# Patient Record
Sex: Female | Born: 1985 | Race: Black or African American | Hispanic: No | Marital: Single | Smoking: Former smoker
Health system: Southern US, Community
[De-identification: ages and names within clinical notes are randomized; demographics above are authoritative.]

## PROBLEM LIST (undated history)

## (undated) ENCOUNTER — Inpatient Hospital Stay (HOSPITAL_COMMUNITY): Payer: Self-pay

## (undated) DIAGNOSIS — B9689 Other specified bacterial agents as the cause of diseases classified elsewhere: Secondary | ICD-10-CM

## (undated) DIAGNOSIS — N760 Acute vaginitis: Secondary | ICD-10-CM

## (undated) DIAGNOSIS — Z8619 Personal history of other infectious and parasitic diseases: Secondary | ICD-10-CM

## (undated) DIAGNOSIS — IMO0002 Reserved for concepts with insufficient information to code with codable children: Secondary | ICD-10-CM

## (undated) DIAGNOSIS — R87619 Unspecified abnormal cytological findings in specimens from cervix uteri: Secondary | ICD-10-CM

## (undated) DIAGNOSIS — Z331 Pregnant state, incidental: Secondary | ICD-10-CM

## (undated) DIAGNOSIS — Z8742 Personal history of other diseases of the female genital tract: Secondary | ICD-10-CM

## (undated) DIAGNOSIS — F329 Major depressive disorder, single episode, unspecified: Secondary | ICD-10-CM

## (undated) DIAGNOSIS — R51 Headache: Secondary | ICD-10-CM

## (undated) DIAGNOSIS — F32A Depression, unspecified: Secondary | ICD-10-CM

## (undated) HISTORY — DX: Reserved for concepts with insufficient information to code with codable children: IMO0002

## (undated) HISTORY — DX: Major depressive disorder, single episode, unspecified: F32.9

## (undated) HISTORY — DX: Headache: R51

## (undated) HISTORY — DX: Personal history of other diseases of the female genital tract: Z87.42

## (undated) HISTORY — DX: Depression, unspecified: F32.A

## (undated) HISTORY — DX: Unspecified abnormal cytological findings in specimens from cervix uteri: R87.619

## (undated) HISTORY — DX: Personal history of other infectious and parasitic diseases: Z86.19

## (undated) HISTORY — DX: Pregnant state, incidental: Z33.1

---
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
## 2007-01-23 ENCOUNTER — Inpatient Hospital Stay (HOSPITAL_COMMUNITY): Admission: AD | Admit: 2007-01-23 | Discharge: 2007-01-23 | Payer: Self-pay | Admitting: Obstetrics & Gynecology

## 2007-07-01 ENCOUNTER — Inpatient Hospital Stay (HOSPITAL_COMMUNITY): Admission: AD | Admit: 2007-07-01 | Discharge: 2007-07-01 | Payer: Self-pay | Admitting: Obstetrics & Gynecology

## 2007-10-14 ENCOUNTER — Emergency Department (HOSPITAL_COMMUNITY): Admission: EM | Admit: 2007-10-14 | Discharge: 2007-10-14 | Payer: Self-pay | Admitting: Emergency Medicine

## 2008-02-18 ENCOUNTER — Inpatient Hospital Stay (HOSPITAL_COMMUNITY): Admission: AD | Admit: 2008-02-18 | Discharge: 2008-02-18 | Payer: Self-pay | Admitting: Obstetrics & Gynecology

## 2008-03-22 ENCOUNTER — Emergency Department (HOSPITAL_COMMUNITY): Admission: EM | Admit: 2008-03-22 | Discharge: 2008-03-22 | Payer: Self-pay | Admitting: Emergency Medicine

## 2008-04-07 ENCOUNTER — Emergency Department (HOSPITAL_COMMUNITY): Admission: EM | Admit: 2008-04-07 | Discharge: 2008-04-07 | Payer: Self-pay | Admitting: Emergency Medicine

## 2008-08-27 ENCOUNTER — Emergency Department (HOSPITAL_COMMUNITY): Admission: EM | Admit: 2008-08-27 | Discharge: 2008-08-27 | Payer: Self-pay | Admitting: Emergency Medicine

## 2009-04-18 ENCOUNTER — Other Ambulatory Visit: Payer: Self-pay | Admitting: Obstetrics & Gynecology

## 2009-04-18 ENCOUNTER — Inpatient Hospital Stay (HOSPITAL_COMMUNITY): Admission: AD | Admit: 2009-04-18 | Discharge: 2009-04-18 | Payer: Self-pay | Admitting: Obstetrics

## 2009-11-12 ENCOUNTER — Other Ambulatory Visit: Admission: RE | Admit: 2009-11-12 | Discharge: 2009-11-12 | Payer: Self-pay | Admitting: Gynecology

## 2009-11-12 ENCOUNTER — Ambulatory Visit: Payer: Self-pay | Admitting: Gynecology

## 2009-12-16 ENCOUNTER — Emergency Department (HOSPITAL_COMMUNITY): Admission: EM | Admit: 2009-12-16 | Discharge: 2009-12-16 | Payer: Self-pay | Admitting: Family Medicine

## 2010-01-04 ENCOUNTER — Emergency Department (HOSPITAL_COMMUNITY): Admission: EM | Admit: 2010-01-04 | Discharge: 2010-01-04 | Payer: Self-pay | Admitting: Emergency Medicine

## 2010-06-02 LAB — URINALYSIS, ROUTINE W REFLEX MICROSCOPIC
Glucose, UA: NEGATIVE mg/dL
Nitrite: NEGATIVE
Specific Gravity, Urine: 1.025 (ref 1.005–1.030)
pH: 6.5 (ref 5.0–8.0)

## 2010-06-02 LAB — WET PREP, GENITAL
Clue Cells Wet Prep HPF POC: NONE SEEN
Trich, Wet Prep: NONE SEEN

## 2010-06-02 LAB — GC/CHLAMYDIA PROBE AMP, GENITAL: Chlamydia, DNA Probe: NEGATIVE

## 2010-07-08 ENCOUNTER — Inpatient Hospital Stay (INDEPENDENT_AMBULATORY_CARE_PROVIDER_SITE_OTHER)
Admission: RE | Admit: 2010-07-08 | Discharge: 2010-07-08 | Disposition: A | Discharge status: Home / Self Care | Payer: Self-pay | Source: Ambulatory Visit | Attending: Family Medicine | Admitting: Family Medicine

## 2010-07-08 DIAGNOSIS — K047 Periapical abscess without sinus: Secondary | ICD-10-CM

## 2010-07-08 DIAGNOSIS — B373 Candidiasis of vulva and vagina: Secondary | ICD-10-CM

## 2010-07-08 LAB — POCT URINALYSIS DIP (DEVICE)
Bilirubin Urine: NEGATIVE
Glucose, UA: NEGATIVE mg/dL
Ketones, ur: NEGATIVE mg/dL
Nitrite: NEGATIVE
Specific Gravity, Urine: 1.02 (ref 1.005–1.030)
Urobilinogen, UA: 0.2 mg/dL (ref 0.0–1.0)

## 2010-11-28 ENCOUNTER — Emergency Department (HOSPITAL_COMMUNITY)
Admission: EM | Admit: 2010-11-28 | Discharge: 2010-11-28 | Disposition: A | Discharge status: Home / Self Care | Payer: Self-pay | Attending: Internal Medicine | Admitting: Internal Medicine

## 2010-11-28 DIAGNOSIS — N898 Other specified noninflammatory disorders of vagina: Secondary | ICD-10-CM | POA: Insufficient documentation

## 2010-11-28 DIAGNOSIS — K029 Dental caries, unspecified: Secondary | ICD-10-CM | POA: Insufficient documentation

## 2010-11-28 DIAGNOSIS — H5789 Other specified disorders of eye and adnexa: Secondary | ICD-10-CM | POA: Insufficient documentation

## 2010-11-28 LAB — URINALYSIS, ROUTINE W REFLEX MICROSCOPIC
Glucose, UA: NEGATIVE mg/dL
Leukocytes, UA: NEGATIVE
Protein, ur: NEGATIVE mg/dL
Urobilinogen, UA: 1 mg/dL (ref 0.0–1.0)

## 2010-11-28 LAB — WET PREP, GENITAL
Clue Cells Wet Prep HPF POC: NONE SEEN
Trich, Wet Prep: NONE SEEN
Yeast Wet Prep HPF POC: NONE SEEN

## 2010-11-28 LAB — URINE MICROSCOPIC-ADD ON

## 2010-11-29 LAB — GC/CHLAMYDIA PROBE AMP, GENITAL: GC Probe Amp, Genital: NEGATIVE

## 2010-12-07 LAB — URINALYSIS, ROUTINE W REFLEX MICROSCOPIC
Bilirubin Urine: NEGATIVE
Hgb urine dipstick: NEGATIVE
Nitrite: NEGATIVE
Specific Gravity, Urine: 1.02
Urobilinogen, UA: 1
pH: 6.5

## 2010-12-07 LAB — WET PREP, GENITAL: Clue Cells Wet Prep HPF POC: NONE SEEN

## 2010-12-07 LAB — URINE MICROSCOPIC-ADD ON

## 2010-12-07 LAB — GC/CHLAMYDIA PROBE AMP, GENITAL: Chlamydia, DNA Probe: NEGATIVE

## 2010-12-10 LAB — RAPID STREP SCREEN (MED CTR MEBANE ONLY): Streptococcus, Group A Screen (Direct): NEGATIVE

## 2010-12-17 LAB — POCT PREGNANCY, URINE: Preg Test, Ur: NEGATIVE

## 2010-12-17 LAB — WET PREP, GENITAL

## 2010-12-21 LAB — URINALYSIS, ROUTINE W REFLEX MICROSCOPIC
Glucose, UA: NEGATIVE
Nitrite: NEGATIVE
Specific Gravity, Urine: 1.025
pH: 6

## 2010-12-21 LAB — GC/CHLAMYDIA PROBE AMP, GENITAL: GC Probe Amp, Genital: NEGATIVE

## 2010-12-21 LAB — POCT PREGNANCY, URINE
Operator id: 26329
Preg Test, Ur: NEGATIVE

## 2010-12-21 LAB — WET PREP, GENITAL

## 2011-02-15 ENCOUNTER — Inpatient Hospital Stay (HOSPITAL_COMMUNITY)
Admission: AD | Admit: 2011-02-15 | Discharge: 2011-02-15 | Disposition: A | Discharge status: Home / Self Care | Payer: Self-pay | Source: Ambulatory Visit | Attending: Obstetrics & Gynecology | Admitting: Obstetrics & Gynecology

## 2011-02-15 ENCOUNTER — Encounter (HOSPITAL_COMMUNITY): Payer: Self-pay | Admitting: *Deleted

## 2011-02-15 DIAGNOSIS — IMO0002 Reserved for concepts with insufficient information to code with codable children: Secondary | ICD-10-CM | POA: Insufficient documentation

## 2011-02-15 DIAGNOSIS — Z3201 Encounter for pregnancy test, result positive: Secondary | ICD-10-CM | POA: Insufficient documentation

## 2011-02-15 DIAGNOSIS — W57XXXA Bitten or stung by nonvenomous insect and other nonvenomous arthropods, initial encounter: Secondary | ICD-10-CM

## 2011-02-15 LAB — URINALYSIS, ROUTINE W REFLEX MICROSCOPIC
Glucose, UA: NEGATIVE mg/dL
Hgb urine dipstick: NEGATIVE
Specific Gravity, Urine: 1.02 (ref 1.005–1.030)
Urobilinogen, UA: 0.2 mg/dL (ref 0.0–1.0)
pH: 6 (ref 5.0–8.0)

## 2011-02-15 LAB — POCT PREGNANCY, URINE: Preg Test, Ur: POSITIVE

## 2011-02-15 MED ORDER — CLINDAMYCIN HCL 300 MG PO CAPS: 300.0000 mg | ORAL_CAPSULE | Freq: Three times a day (TID) | ORAL | Status: AC

## 2011-02-15 MED ORDER — PROMETHAZINE HCL 25 MG PO TABS: 25.0000 mg | ORAL_TABLET | Freq: Four times a day (QID) | ORAL | Status: DC | PRN

## 2011-02-15 NOTE — ED Provider Notes (Signed)
History     Chief Complaint  Patient presents with  . Abdominal Pain   HPI 25 y.o. G1P0 here requesting pregnancy test. Denies abdominal pain or vaginal bleeding. + nausea, no vomiting, dry heaving only. Also c/o "bug bites" on her arms that are swollen and tender, states the same thing happened before and she was treated for cellulitis.    Past Medical History  Diagnosis Date  . No pertinent past medical history     Past Surgical History  Procedure Date  . No past surgeries     History reviewed. No pertinent family history.  History  Substance Use Topics  . Smoking status: Current Everyday Smoker -- 0.2 packs/day  . Smokeless tobacco: Not on file  . Alcohol Use: Yes    Allergies: No Known Allergies  Prescriptions prior to admission  Medication Sig Dispense Refill  . ibuprofen (ADVIL,MOTRIN) 200 MG tablet Take 400 mg by mouth every 6 (six) hours as needed. Pain          Review of Systems  Constitutional: Negative.   Respiratory: Negative.   Cardiovascular: Negative.   Gastrointestinal: Positive for nausea. Negative for vomiting, abdominal pain, diarrhea and constipation.  Genitourinary: Negative for dysuria, urgency, frequency, hematuria and flank pain.       Negative for vaginal bleeding, vaginal discharge, dyspareunia  Musculoskeletal: Negative.   Skin: Positive for rash.  Neurological: Negative.   Psychiatric/Behavioral: Negative.    Physical Exam   Blood pressure 128/73, pulse 88, temperature 99 F (37.2 C), temperature source Oral, resp. rate 20, height 5\' 4"  (1.626 m), weight 84.596 kg (186 lb 8 oz), last menstrual period 01/14/2011, SpO2 99.00%.  Physical Exam  Nursing note and vitals reviewed. Constitutional: She is oriented to person, place, and time. She appears well-developed and well-nourished. No distress.  Cardiovascular: Normal rate.   Respiratory: Effort normal.  GI: Soft. There is no tenderness.  Musculoskeletal: Normal range of motion.    Neurological: She is alert and oriented to person, place, and time.  Skin: Skin is warm and dry.          Left arm - erythematous, swollen, tender area with small central lesion that is c/w insect bite Right upper arm - smaller insect bite with no erythema or swelling     MAU Course  Procedures  Results for orders placed during the hospital encounter of 02/15/11 (from the past 24 hour(s))  URINALYSIS, ROUTINE W REFLEX MICROSCOPIC     Status: Normal   Collection Time   02/15/11  8:45 PM      Component Value Range   Color, Urine YELLOW  YELLOW    APPearance CLEAR  CLEAR    Specific Gravity, Urine 1.020  1.005 - 1.030    pH 6.0  5.0 - 8.0    Glucose, UA NEGATIVE  NEGATIVE (mg/dL)   Hgb urine dipstick NEGATIVE  NEGATIVE    Bilirubin Urine NEGATIVE  NEGATIVE    Ketones, ur NEGATIVE  NEGATIVE (mg/dL)   Protein, ur NEGATIVE  NEGATIVE (mg/dL)   Urobilinogen, UA 0.2  0.0 - 1.0 (mg/dL)   Nitrite NEGATIVE  NEGATIVE    Leukocytes, UA NEGATIVE  NEGATIVE   POCT PREGNANCY, URINE     Status: Normal   Collection Time   02/15/11  8:50 PM      Component Value Range   Preg Test, Ur POSITIVE       Assessment and Plan  25 y.o. G1P0 with positive UPT Pregnancy verification given - start  prenatal care ASAP, rev'd precautions Cellulitis left arm - rx clindamycin, f/u if no improvement  Angela Brock 02/15/2011, 9:11 PM

## 2011-02-15 NOTE — Progress Notes (Signed)
N. [name], CNM at bedside.  Assessment done and poc discussed with pt.  

## 2011-02-15 NOTE — Progress Notes (Signed)
Pt presents to [facility] for c/o stomach pain and being late on her period.  Not sure if she might be pregnant or not.  Also has some bites with redness on lower left arm and upper righ arm.

## 2011-02-17 NOTE — ED Provider Notes (Signed)
Attestation of Attending Supervision of Advanced Practitioner: Evaluation and management procedures were performed by the PA/NP/CNM/OB Fellow under my supervision/collaboration. Chart reviewed, and agree with management and plan.  Duyen Beckom, M.D. 02/17/2011 8:25 AM   

## 2011-02-19 ENCOUNTER — Inpatient Hospital Stay (HOSPITAL_COMMUNITY)
Admission: AD | Admit: 2011-02-19 | Discharge: 2011-02-19 | Disposition: A | Discharge status: Home / Self Care | Payer: Medicaid Other | Source: Ambulatory Visit | Attending: Obstetrics and Gynecology | Admitting: Obstetrics and Gynecology

## 2011-02-19 ENCOUNTER — Encounter (HOSPITAL_COMMUNITY): Payer: Self-pay | Admitting: *Deleted

## 2011-02-19 ENCOUNTER — Inpatient Hospital Stay (HOSPITAL_COMMUNITY): Payer: Medicaid Other

## 2011-02-19 DIAGNOSIS — R1032 Left lower quadrant pain: Secondary | ICD-10-CM | POA: Insufficient documentation

## 2011-02-19 DIAGNOSIS — O99891 Other specified diseases and conditions complicating pregnancy: Secondary | ICD-10-CM | POA: Insufficient documentation

## 2011-02-19 DIAGNOSIS — R109 Unspecified abdominal pain: Secondary | ICD-10-CM

## 2011-02-19 DIAGNOSIS — O26899 Other specified pregnancy related conditions, unspecified trimester: Secondary | ICD-10-CM

## 2011-02-19 LAB — WET PREP, GENITAL
Trich, Wet Prep: NONE SEEN
Yeast Wet Prep HPF POC: NONE SEEN

## 2011-02-19 LAB — CBC
HCT: 37.2 % (ref 36.0–46.0)
Hemoglobin: 12.6 g/dL (ref 12.0–15.0)
MCHC: 33.9 g/dL (ref 30.0–36.0)
MCV: 80.3 fL (ref 78.0–100.0)

## 2011-02-19 LAB — ABO/RH: ABO/RH(D): A POS

## 2011-02-19 NOTE — Progress Notes (Signed)
Pt reports having a sharp cramping  pain in her LLQ x 4 days.Having some vaginal bleeding an irritation.

## 2011-02-19 NOTE — ED Provider Notes (Signed)
History   Pt presents today c/o LLQ pain for the past 4 days and she also noticed some vag spotting. She denies fever, dysuria, or severe pain. She states she is just worried that she could be pregnant in her tubes.  Chief Complaint  Patient presents with  . Abdominal Pain   HPI  OB History    Grav Para Term Preterm Abortions TAB SAB Ect Mult Living   1               Past Medical History  Diagnosis Date  . No pertinent past medical history     Past Surgical History  Procedure Date  . No past surgeries     Family History  Problem Relation Age of Onset  . Hypertension Mother     History  Substance Use Topics  . Smoking status: Current Everyday Smoker -- 0.2 packs/day  . Smokeless tobacco: Not on file  . Alcohol Use: Yes    Allergies: No Known Allergies  Prescriptions prior to admission  Medication Sig Dispense Refill  . clindamycin (CLEOCIN) 300 MG capsule Take 1 capsule (300 mg total) by mouth 3 (three) times daily.  21 capsule  0  . promethazine (PHENERGAN) 25 MG tablet Take 1 tablet (25 mg total) by mouth every 6 (six) hours as needed for nausea.  60 tablet  0    Review of Systems  Constitutional: Negative for fever.  Eyes: Negative for blurred vision.  Gastrointestinal: Positive for abdominal pain. Negative for nausea, vomiting, diarrhea and constipation.  Genitourinary: Negative for dysuria, urgency, frequency and hematuria.  Neurological: Negative for dizziness and headaches.  Psychiatric/Behavioral: Negative for depression and suicidal ideas.   Physical Exam   Blood pressure 115/78, pulse 82, temperature 98.5 F (36.9 C), temperature source Oral, resp. rate 18, height 5\' 4"  (1.626 m), weight 181 lb 6.4 oz (82.283 kg), last menstrual period 01/14/2011.  Physical Exam  Nursing note and vitals reviewed. Constitutional: She is oriented to person, place, and time. She appears well-developed and well-nourished. No distress.  HENT:  Head: Normocephalic and  atraumatic.  Eyes: EOM are normal. Pupils are equal, round, and reactive to light.  GI: Soft. She exhibits no distension. There is no tenderness. There is no rebound and no guarding.  Genitourinary: No bleeding around the vagina. Vaginal discharge found.       Cervix Lg/closed. No adnexal masses. Pt nontender on exam. Uterus 6-8wks size.  Neurological: She is alert and oriented to person, place, and time.  Skin: Skin is warm and dry. She is not diaphoretic.  Psychiatric: She has a normal mood and affect. Her behavior is normal. Judgment and thought content normal.    MAU Course  Procedures  Wet prep and GC/Chlamydia cultures done.  Results for orders placed during the hospital encounter of 02/19/11 (from the past 24 hour(s))  ABO/RH     Status: Normal   Collection Time   02/19/11 12:10 PM      Component Value Range   ABO/RH(D) A POS    CBC     Status: Abnormal   Collection Time   02/19/11 12:10 PM      Component Value Range   WBC 10.7 (*) 4.0 - 10.5 (K/uL)   RBC 4.63  3.87 - 5.11 (MIL/uL)   Hemoglobin 12.6  12.0 - 15.0 (g/dL)   HCT 16.1  09.6 - 04.5 (%)   MCV 80.3  78.0 - 100.0 (fL)   MCH 27.2  26.0 - 34.0 (pg)  MCHC 33.9  30.0 - 36.0 (g/dL)   RDW 40.9  81.1 - 91.4 (%)   Platelets 371  150 - 400 (K/uL)  HCG, QUANTITATIVE, PREGNANCY     Status: Abnormal   Collection Time   02/19/11 12:10 PM      Component Value Range   hCG, Beta Chain, Quant, S 2683 (*) <5 (mIU/mL)  WET PREP, GENITAL     Status: Abnormal   Collection Time   02/19/11 12:25 PM      Component Value Range   Yeast, Wet Prep NONE SEEN  NONE SEEN    Trich, Wet Prep NONE SEEN  NONE SEEN    Clue Cells, Wet Prep FEW (*) NONE SEEN    WBC, Wet Prep HPF POC FEW (*) NONE SEEN    US shows intrauterine gestational sac. No yolk sac seen. Large subchorionic hemorrhage noted. Assessment and Plan  Pain in preg: discussed with pt at length. She will return in 2 days for repeat B-quant. Discussed diet, activity, risks, and  precautions.   Clinton Gallant. Saron Tweed III, DrHSc, MPAS, PA-C  02/19/2011, 12:30 PM   Henrietta Hoover, PA 02/19/11 [id number]

## 2011-02-20 NOTE — ED Provider Notes (Signed)
Agree with above note.  Angela Brock 02/20/2011 7:15 AM

## 2011-02-21 ENCOUNTER — Inpatient Hospital Stay (HOSPITAL_COMMUNITY)
Admission: AD | Admit: 2011-02-21 | Discharge: 2011-02-21 | Disposition: A | Discharge status: Home / Self Care | Payer: Self-pay | Source: Ambulatory Visit | Attending: Obstetrics & Gynecology | Admitting: Obstetrics & Gynecology

## 2011-02-21 DIAGNOSIS — O26859 Spotting complicating pregnancy, unspecified trimester: Secondary | ICD-10-CM

## 2011-02-21 LAB — GC/CHLAMYDIA PROBE AMP, GENITAL
Chlamydia, DNA Probe: NEGATIVE
GC Probe Amp, Genital: NEGATIVE

## 2011-02-21 NOTE — ED Provider Notes (Signed)
History     Chief Complaint  Patient presents with  . Follow-up   HPI 25 y.o. G1P0 at [redacted]w[redacted]d here for f/u HCG. Seen on 12/8 with bleeding, quant was 2683 and u/s showed 5 wk size IUGS with no yolk sac,no adnexal mass, large subchorionic hemorrhage. Today having only spotting.     Past Medical History  Diagnosis Date  . No pertinent past medical history     Past Surgical History  Procedure Date  . No past surgeries     Family History  Problem Relation Age of Onset  . Hypertension Mother     History  Substance Use Topics  . Smoking status: Current Everyday Smoker -- 0.2 packs/day  . Smokeless tobacco: Not on file  . Alcohol Use: Yes    Allergies: No Known Allergies  Prescriptions prior to admission  Medication Sig Dispense Refill  . acetaminophen (TYLENOL) 500 MG tablet Take 1,000 mg by mouth every 6 (six) hours as needed. Patient took this medication for pain.       . clindamycin (CLEOCIN) 300 MG capsule Take 1 capsule (300 mg total) by mouth 3 (three) times daily.  21 capsule  0  . promethazine (PHENERGAN) 25 MG tablet Take 1 tablet (25 mg total) by mouth every 6 (six) hours as needed for nausea.  60 tablet  0    Review of Systems  Constitutional: Negative.   Respiratory: Negative.   Cardiovascular: Negative.   Gastrointestinal: Negative for nausea, vomiting, abdominal pain, diarrhea and constipation.  Genitourinary: Negative for dysuria, urgency, frequency, hematuria and flank pain.       + spotting  Musculoskeletal: Negative.   Neurological: Negative.   Psychiatric/Behavioral: Negative.    Physical Exam   Blood pressure 132/70, pulse 91, temperature 98.5 F (36.9 C), temperature source Oral, resp. rate 20, last menstrual period 01/14/2011.  Physical Exam  Nursing note and vitals reviewed. Constitutional: She is oriented to person, place, and time. She appears well-developed and well-nourished. No distress.  Cardiovascular: Normal rate.   Respiratory:  Effort normal.  Musculoskeletal: Normal range of motion.  Neurological: She is alert and oriented to person, place, and time.  Skin: Skin is warm and dry.  Psychiatric: She has a normal mood and affect.    MAU Course  Procedures  Results for orders placed during the hospital encounter of 02/21/11 (from the past 24 hour(s))  HCG, QUANTITATIVE, PREGNANCY     Status: Abnormal   Collection Time   02/21/11  7:09 AM      Component Value Range   hCG, Beta Chain, Quant, S 5371 (*) <5 (mIU/mL)     Assessment and Plan  Appropriate rise in HCG Repeat u/s in 1 week Precautions rev'd  Constant Mandeville 02/21/2011, 8:06 AM

## 2011-02-21 NOTE — Progress Notes (Signed)
Patient to MAU for repeat BHCG. Patient states she is having a little left flank pain and spotting on an off.

## 2011-02-28 ENCOUNTER — Inpatient Hospital Stay (HOSPITAL_COMMUNITY)
Admission: AD | Admit: 2011-02-28 | Discharge: 2011-02-28 | Disposition: A | Discharge status: Home / Self Care | Payer: Self-pay | Source: Ambulatory Visit | Attending: Obstetrics & Gynecology | Admitting: Obstetrics & Gynecology

## 2011-02-28 ENCOUNTER — Encounter (HOSPITAL_COMMUNITY): Payer: Self-pay | Admitting: *Deleted

## 2011-02-28 ENCOUNTER — Inpatient Hospital Stay (HOSPITAL_COMMUNITY): Payer: Self-pay

## 2011-02-28 DIAGNOSIS — O99891 Other specified diseases and conditions complicating pregnancy: Secondary | ICD-10-CM | POA: Insufficient documentation

## 2011-02-28 DIAGNOSIS — Z3400 Encounter for supervision of normal first pregnancy, unspecified trimester: Secondary | ICD-10-CM

## 2011-02-28 MED ORDER — PROMETHAZINE HCL 25 MG PO TABS: 25.0000 mg | ORAL_TABLET | Freq: Four times a day (QID) | ORAL | Status: DC | PRN

## 2011-02-28 NOTE — Progress Notes (Signed)
Here for repeat/viability [place].  No further spotting.  Feeling nauseated frequently.

## 2011-02-28 NOTE — ED Provider Notes (Signed)
History   Pt presents today for viability [place]. She states she no longer has any bleeding. She does report nausea. She denies fever, vag dc, bleeding, pain, or any other sx at this time.  Chief Complaint  Patient presents with  . Follow-up   HPI  OB History    Grav Para Term Preterm Abortions TAB SAB Ect Mult Living   1               Past Medical History  Diagnosis Date  . No pertinent past medical history     Past Surgical History  Procedure Date  . No past surgeries     Family History  Problem Relation Age of Onset  . Hypertension Mother   . Anesthesia problems Neg Hx     History  Substance Use Topics  . Smoking status: Former Smoker -- 0.2 packs/day  . Smokeless tobacco: Never Used  . Alcohol Use: Yes     not currently    Allergies: No Known Allergies  Prescriptions prior to admission  Medication Sig Dispense Refill  . Prenatal Vit-Fe Fum-FA-Omega (PRENATAL MULTI +DHA PO) Take by mouth 1 day or 1 dose.        Marland Kitchen acetaminophen (TYLENOL) 500 MG tablet Take 1,000 mg by mouth every 6 (six) hours as needed. Patient took this medication for pain.         Review of Systems  Constitutional: Negative for fever.  Cardiovascular: Negative for chest pain.  Gastrointestinal: Positive for nausea and vomiting. Negative for abdominal pain, diarrhea and constipation.  Genitourinary: Negative for dysuria, urgency, frequency and hematuria.  Neurological: Negative for dizziness and headaches.  Psychiatric/Behavioral: Negative for depression and suicidal ideas.   Physical Exam   Blood pressure 112/68, pulse 84, temperature 98.9 F (37.2 C), temperature source Oral, resp. rate 18, height 5\' 3"  (1.6 m), weight 182 lb 6.4 oz (82.736 kg), last menstrual period 01/14/2011.  Physical Exam  Nursing note and vitals reviewed. Constitutional: She is oriented to person, place, and time. She appears well-developed and well-nourished. No distress.  HENT:  Head: Normocephalic and  atraumatic.  Eyes: EOM are normal. Pupils are equal, round, and reactive to light.  GI: Soft. She exhibits no distension. There is no tenderness. There is no rebound and no guarding.  Neurological: She is alert and oriented to person, place, and time.  Skin: Skin is warm and dry. She is not diaphoretic.  Psychiatric: She has a normal mood and affect. Her behavior is normal. Judgment and thought content normal.    MAU Course  Procedures  US shows single IUP with cardiac activity and an EGA of 6.1wks and an EDC of 10/23/11.  Assessment and Plan  IUP: discussed with pt at length. Will give Rx for phenergan. She will begin prenatal care. Discussed diet, activity, risks,and precautions.  Clinton Gallant. Sheyna Pettibone III, DrHSc, MPAS, PA-C  02/28/2011, 8:23 AM   Henrietta Hoover, PA 02/28/11 [phone]

## 2011-04-07 ENCOUNTER — Inpatient Hospital Stay (HOSPITAL_COMMUNITY)
Admission: AD | Admit: 2011-04-07 | Discharge: 2011-04-07 | Disposition: A | Discharge status: Home / Self Care | Payer: Medicaid Other | Source: Ambulatory Visit | Attending: Obstetrics & Gynecology | Admitting: Obstetrics & Gynecology

## 2011-04-07 ENCOUNTER — Encounter (HOSPITAL_COMMUNITY): Payer: Self-pay | Admitting: *Deleted

## 2011-04-07 DIAGNOSIS — B9689 Other specified bacterial agents as the cause of diseases classified elsewhere: Secondary | ICD-10-CM

## 2011-04-07 DIAGNOSIS — O2390 Unspecified genitourinary tract infection in pregnancy, unspecified trimester: Secondary | ICD-10-CM | POA: Insufficient documentation

## 2011-04-07 DIAGNOSIS — N760 Acute vaginitis: Secondary | ICD-10-CM

## 2011-04-07 DIAGNOSIS — A499 Bacterial infection, unspecified: Secondary | ICD-10-CM

## 2011-04-07 LAB — CBC
HCT: 30.9 % — ABNORMAL LOW (ref 36.0–46.0)
MCHC: 33.7 g/dL (ref 30.0–36.0)
MCV: 78.6 fL (ref 78.0–100.0)
RDW: 12.9 % (ref 11.5–15.5)

## 2011-04-07 LAB — URINALYSIS, ROUTINE W REFLEX MICROSCOPIC
Glucose, UA: NEGATIVE mg/dL
Hgb urine dipstick: NEGATIVE
Ketones, ur: NEGATIVE mg/dL
Protein, ur: NEGATIVE mg/dL

## 2011-04-07 LAB — WET PREP, GENITAL

## 2011-04-07 MED ORDER — ACETAMINOPHEN 325 MG PO TABS
650.0000 mg | ORAL_TABLET | ORAL | Status: AC
  Administered 2011-04-07: 650 mg via ORAL
  Filled 2011-04-07: qty 2

## 2011-04-07 MED ORDER — METRONIDAZOLE 500 MG PO TABS: 500.0000 mg | ORAL_TABLET | Freq: Two times a day (BID) | ORAL | Status: AC

## 2011-04-07 NOTE — Progress Notes (Signed)
Lots of discharge, has been dizzy, hasn't had baby checked.  First appt. 02/14.

## 2011-04-07 NOTE — ED Provider Notes (Signed)
History    G1 presents to MAU at 11w 6 d with vaginal discharge with itch and odor and feeling dizzy today and has a mild h/a.  She reports she has been busy today, has been on labor and delivery with her sister who had a baby.  No chief complaint on file.  HPI  OB History    Grav Para Term Preterm Abortions TAB SAB Ect Mult Living   1               Past Medical History  Diagnosis Date  . No pertinent past medical history     Past Surgical History  Procedure Date  . No past surgeries     Family History  Problem Relation Age of Onset  . Hypertension Mother   . Anesthesia problems Neg Hx     History  Substance Use Topics  . Smoking status: Former Smoker -- 0.2 packs/day  . Smokeless tobacco: Never Used  . Alcohol Use: Yes     not currently    Allergies: No Known Allergies  Prescriptions prior to admission  Medication Sig Dispense Refill  . acetaminophen (TYLENOL) 500 MG tablet Take 1,000 mg by mouth every 6 (six) hours as needed. Patient took this medication for pain.       . Prenatal Vit-Fe Fumarate-FA (PRENATAL MULTIVITAMIN) TABS Take 1 tablet by mouth daily.      Marland Kitchen DISCONTD: Prenatal Vit-Fe Fum-FA-Omega (PRENATAL MULTI +DHA PO) Take by mouth 1 day or 1 dose.          Review of Systems  Constitutional: Negative.   HENT: Negative.   Eyes: Negative.   Respiratory: Negative.   Cardiovascular: Negative.   Gastrointestinal: Negative.   Genitourinary: Negative.   Musculoskeletal: Negative.   Skin: Negative.   Neurological: Negative.   Endo/Heme/Allergies: Negative.   Psychiatric/Behavioral: Negative.    Physical Exam   Blood pressure 127/78, pulse 100, temperature 98.7 F (37.1 C), temperature source Oral, resp. rate 20, height 5\' 5"  (1.651 m), weight 83.462 kg (184 lb), last menstrual period 01/14/2011, SpO2 99.00%.  Physical Exam  Constitutional: She is oriented to person, place, and time. She appears well-developed and well-nourished.  Neck: Normal  range of motion.  Respiratory: Effort normal.  GI: Soft.  Genitourinary: Vagina normal. Uterus is enlarged. Cervix exhibits no motion tenderness and no friability.       Uterus ~11 cm Cervix pink, nonfriable with white thin discharge noted from cervical os and on vaginal walls  Musculoskeletal: Normal range of motion.  Neurological: She is alert and oriented to person, place, and time.  Skin: Skin is warm and dry.  Psychiatric: She has a normal mood and affect. Her behavior is normal. Judgment and thought content normal.   Results for orders placed during the hospital encounter of 04/07/11 (from the past 24 hour(s))  URINALYSIS, ROUTINE W REFLEX MICROSCOPIC     Status: Abnormal   Collection Time   04/07/11  5:30 PM      Component Value Range   Color, Urine YELLOW  YELLOW    APPearance HAZY (*) CLEAR    Specific Gravity, Urine 1.020  1.005 - 1.030    pH 6.5  5.0 - 8.0    Glucose, UA NEGATIVE  NEGATIVE (mg/dL)   Hgb urine dipstick NEGATIVE  NEGATIVE    Bilirubin Urine NEGATIVE  NEGATIVE    Ketones, ur NEGATIVE  NEGATIVE (mg/dL)   Protein, ur NEGATIVE  NEGATIVE (mg/dL)   Urobilinogen, UA 1.0  0.0 -  1.0 (mg/dL)   Nitrite NEGATIVE  NEGATIVE    Leukocytes, UA NEGATIVE  NEGATIVE   CBC     Status: Abnormal   Collection Time   04/07/11  8:15 PM      Component Value Range   WBC 10.5  4.0 - 10.5 (K/uL)   RBC 3.93  3.87 - 5.11 (MIL/uL)   Hemoglobin 10.4 (*) 12.0 - 15.0 (g/dL)   HCT 16.1 (*) 09.6 - 46.0 (%)   MCV 78.6  78.0 - 100.0 (fL)   MCH 26.5  26.0 - 34.0 (pg)   MCHC 33.7  30.0 - 36.0 (g/dL)   RDW 04.5  40.9 - 81.1 (%)   Platelets 309  150 - 400 (K/uL)  WET PREP, GENITAL     Status: Abnormal   Collection Time   04/07/11  8:20 PM      Component Value Range   Yeast, Wet Prep NONE SEEN  NONE SEEN    Trich, Wet Prep NONE SEEN  NONE SEEN    Clue Cells, Wet Prep MODERATE (*) NONE SEEN    WBC, Wet Prep HPF POC FEW (*) NONE SEEN    MAU Course  Procedures Pelvic exam with wet prep,  GC/Chlamydia Tylenol 650 mg PO for h/a  Assessment and Plan  A: Bacterial vaginosis  P: D/C home  Flagyl 500mg  PO BID for 7 days Encouraged PO fluids and rest tonight after being up on her feet all day today  LEFTW[place]-KIRBY, Lenin Kuhnle 04/07/2011, 8:42 PM

## 2011-05-14 ENCOUNTER — Inpatient Hospital Stay (HOSPITAL_COMMUNITY)
Admission: AD | Admit: 2011-05-14 | Discharge: 2011-05-14 | Disposition: A | Discharge status: Home / Self Care | Payer: Medicaid Other | Attending: Obstetrics and Gynecology | Admitting: Obstetrics and Gynecology

## 2011-05-14 ENCOUNTER — Encounter (HOSPITAL_COMMUNITY): Payer: Self-pay | Admitting: *Deleted

## 2011-05-14 ENCOUNTER — Inpatient Hospital Stay (HOSPITAL_COMMUNITY): Payer: Medicaid Other

## 2011-05-14 DIAGNOSIS — R1031 Right lower quadrant pain: Secondary | ICD-10-CM | POA: Insufficient documentation

## 2011-05-14 DIAGNOSIS — O99891 Other specified diseases and conditions complicating pregnancy: Secondary | ICD-10-CM | POA: Insufficient documentation

## 2011-05-14 DIAGNOSIS — R109 Unspecified abdominal pain: Secondary | ICD-10-CM

## 2011-05-14 DIAGNOSIS — W010XXA Fall on same level from slipping, tripping and stumbling without subsequent striking against object, initial encounter: Secondary | ICD-10-CM | POA: Insufficient documentation

## 2011-05-14 DIAGNOSIS — O26899 Other specified pregnancy related conditions, unspecified trimester: Secondary | ICD-10-CM

## 2011-05-14 LAB — URINALYSIS, ROUTINE W REFLEX MICROSCOPIC
Glucose, UA: NEGATIVE mg/dL
Ketones, ur: NEGATIVE mg/dL
Leukocytes, UA: NEGATIVE
Specific Gravity, Urine: 1.015 (ref 1.005–1.030)
pH: 7 (ref 5.0–8.0)

## 2011-05-14 MED ORDER — IBUPROFEN 600 MG PO TABS: 600.0000 mg | ORAL_TABLET | Freq: Four times a day (QID) | ORAL | Status: DC | PRN

## 2011-05-14 NOTE — ED Provider Notes (Signed)
History     Chief Complaint  Patient presents with  . Fall  . Abdominal Pain   HPI Pt presents at 18w 1d gestation with c/o s/p fall this evening when she struck the left side of her abdomen.  She states she tripped over her feet and tried to catch herself but struck the left side of her abdomen against the edge of a dresser.  She reports some lower right sided abd pain since that time.  She denies any vaginal bleeding or menstrual cramping.  She has not treated her pain.    OB History    Grav Para Term Preterm Abortions TAB SAB Ect Mult Living   1               Past Medical History  Diagnosis Date  . No pertinent past medical history     Past Surgical History  Procedure Date  . No past surgeries     Family History  Problem Relation Age of Onset  . Hypertension Mother   . Anesthesia problems Neg Hx   . Alcohol abuse Maternal Grandmother   . Alcohol abuse Maternal Grandfather   . Alcohol abuse Paternal Grandfather     History  Substance Use Topics  . Smoking status: Former Smoker -- 0.0 packs/day  . Smokeless tobacco: Never Used  . Alcohol Use: Yes     not currently    Allergies: No Known Allergies  Prescriptions prior to admission  Medication Sig Dispense Refill  . acetaminophen (TYLENOL) 500 MG tablet Take 1,000 mg by mouth every 6 (six) hours as needed. Patient took this medication for pain.       . ferrous sulfate 325 (65 FE) MG tablet Take 325 mg by mouth daily with breakfast.      . Prenatal Vit-Fe Fumarate-FA (PRENATAL MULTIVITAMIN) TABS Take 1 tablet by mouth daily.        Review of Systems  Constitutional: Negative.   HENT: Negative.   Eyes: Negative.   Respiratory: Negative.   Cardiovascular: Negative.   Gastrointestinal: Negative.   Genitourinary: Negative.   Musculoskeletal: Negative.   Skin: Negative.   Neurological: Negative.   Endo/Heme/Allergies: Negative.   Psychiatric/Behavioral: Negative.    Physical Exam   Blood pressure 117/69,  pulse 93, temperature 97 F (36.1 C), temperature source Oral, resp. rate 16, height 5\' 6"  (1.676 m), weight 88.361 kg (194 lb 12.8 oz), last menstrual period 01/14/2011.  Physical Exam  Constitutional: She is oriented to person, place, and time. She appears well-developed and well-nourished.  HENT:  Head: Normocephalic and atraumatic.  Right Ear: External ear normal.  Left Ear: External ear normal.  Nose: Nose normal.  Eyes: Conjunctivae are normal. Pupils are equal, round, and reactive to light.  Neck: Normal range of motion. Neck supple. No thyromegaly present.  Cardiovascular: Normal rate, regular rhythm and intact distal pulses.   Respiratory: Effort normal and breath sounds normal.  GI: Soft. Bowel sounds are normal.       No areas of discoloration or abrasions noted.  Genitourinary: Uterus normal.       Pelvic exam deferred.  Ut gravid, soft, non-tender and mobile to abd exam.  Musculoskeletal: Normal range of motion.  Neurological: She is alert and oriented to person, place, and time. She has normal reflexes.  Skin: Skin is warm and dry.  Psychiatric: She has a normal mood and affect. Her behavior is normal.  FHTs present with doppler.   MAU Course  Procedures Ultrasound:  Viable  IUP c/w dates.  Normal amniotic fluid volume.  Placenta without previa or evid of abruption noted.  Incidental finding of choroid plexus cyst noted.    Assessment and Plan  IUP at 18w 1d S/P fall Fetal choroid plexus cyst  C/W Dr. Stefano Gaul. CP cysts d/w pt and pt has appt with [facility] in the next week for anatomy ultrasound.  Has not had quad screen and will consider. F/U at [facility] as sched Discharge to home. D/W pt may use motrin OTC 600mg  every 6hrs as needed for discomfort. Revd warning signs/symptoms.   Elisheba Mcdonnell O. 05/14/2011, 6:11 PM

## 2011-05-14 NOTE — Discharge Instructions (Signed)
Abdominal Pain During Pregnancy Abdominal discomfort is common in pregnancy. Most of the time, it does not cause harm. There are many causes of abdominal pain. Some causes are more serious than others. Some of the causes of abdominal pain in pregnancy are easily diagnosed. Occasionally, the diagnosis takes time to understand. Other times, the cause is not determined. Abdominal pain can be a sign that something is very wrong with the pregnancy, or the pain may have nothing to do with the pregnancy at all. For this reason, always tell your caregiver if you have any abdominal discomfort. CAUSES Common and harmless causes of abdominal pain include:  Constipation.   Excess gas and bloating.   Round ligament pain. This is pain that is felt in the folds of the groin.   The position the baby or placenta is in.   Baby kicks.   Braxton-Hicks contractions. These are mild contractions that do not cause cervical dilation.  Serious causes of abdominal pain include:  Ectopic pregnancy. This happens when a fertilized egg implants outside of the uterus.   Miscarriage.   Preterm labor. This is when labor starts at less than 37 weeks of pregnancy.   Placental abruption. This is when the placenta partially or completely separates from the uterus.   Preeclampsia. This is often associated with high blood pressure and has been referred to as "toxemia in pregnancy."   Uterine or amniotic fluid infections.  Causes unrelated to pregnancy include:  Urinary tract infection.   Gallbladder stones or inflammation.   Hepatitis or other liver illness.   Intestinal problems, stomach flu, food poisoning, or ulcer.   Appendicitis.   Kidney (renal) stones.   Kidney infection (pylonephritis).  HOME CARE INSTRUCTIONS  For mild pain:  Do not have sexual intercourse or put anything in your vagina until your symptoms go away completely.   Get plenty of rest until your pain improves. If your pain does not  improve in 1 hour, call your caregiver.   Drink clear fluids if you feel nauseous. Avoid solid food as long as you are uncomfortable or nauseous.   Only take medicine as directed by your caregiver.   Keep all follow-up appointments with your caregiver.  SEEK IMMEDIATE MEDICAL CARE IF:  You are bleeding, leaking fluid, or passing tissue from the vagina.   You have increasing pain or cramping.   You have persistent vomiting.   You have painful or bloody urination.   You have a fever.   You notice a decrease in your baby's movements.   You have extreme weakness or feel faint.   You have shortness of breath, with or without abdominal pain.   You develop a severe headache with abdominal pain.   You have abnormal vaginal discharge with abdominal pain.   You have persistent diarrhea.   You have abdominal pain that continues even after rest, or gets worse.  MAKE SURE YOU:   Understand these instructions.   Will watch your condition.   Will get help right away if you are not doing well or get worse.  Document Released: 02/28/2005 Document Revised: 11/10/2010 Document Reviewed: 09/24/2010 ExitCare Patient Information 2012 [employer].Abdominal Pain During Pregnancy Abdominal discomfort is common in pregnancy. Most of the time, it does not cause harm. There are many causes of abdominal pain. Some causes are more serious than others. Some of the causes of abdominal pain in pregnancy are easily diagnosed. Occasionally, the diagnosis takes time to understand. Other times, the cause is not determined.   Abdominal pain can be a sign that something is very wrong with the pregnancy, or the pain may have nothing to do with the pregnancy at all. For this reason, always tell your caregiver if you have any abdominal discomfort. CAUSES Common and harmless causes of abdominal pain include:  Constipation.   Excess gas and bloating.   Round ligament pain. This is pain that is felt in the  folds of the groin.   The position the baby or placenta is in.   Baby kicks.   Braxton-Hicks contractions. These are mild contractions that do not cause cervical dilation.  Serious causes of abdominal pain include:  Ectopic pregnancy. This happens when a fertilized egg implants outside of the uterus.   Miscarriage.   Preterm labor. This is when labor starts at less than 37 weeks of pregnancy.   Placental abruption. This is when the placenta partially or completely separates from the uterus.   Preeclampsia. This is often associated with high blood pressure and has been referred to as "toxemia in pregnancy."   Uterine or amniotic fluid infections.  Causes unrelated to pregnancy include:  Urinary tract infection.   Gallbladder stones or inflammation.   Hepatitis or other liver illness.   Intestinal problems, stomach flu, food poisoning, or ulcer.   Appendicitis.   Kidney (renal) stones.   Kidney infection (pylonephritis).  HOME CARE INSTRUCTIONS  For mild pain:  Do not have sexual intercourse or put anything in your vagina until your symptoms go away completely.   Get plenty of rest until your pain improves. If your pain does not improve in 1 hour, call your caregiver.   Drink clear fluids if you feel nauseous. Avoid solid food as long as you are uncomfortable or nauseous.   Only take medicine as directed by your caregiver.   Keep all follow-up appointments with your caregiver.  SEEK IMMEDIATE MEDICAL CARE IF:  You are bleeding, leaking fluid, or passing tissue from the vagina.   You have increasing pain or cramping.   You have persistent vomiting.   You have painful or bloody urination.   You have a fever.   You notice a decrease in your baby's movements.   You have extreme weakness or feel faint.   You have shortness of breath, with or without abdominal pain.   You develop a severe headache with abdominal pain.   You have abnormal vaginal discharge  with abdominal pain.   You have persistent diarrhea.   You have abdominal pain that continues even after rest, or gets worse.  MAKE SURE YOU:   Understand these instructions.   Will watch your condition.   Will get help right away if you are not doing well or get worse.  Document Released: 02/28/2005 Document Revised: 11/10/2010 Document Reviewed: 09/24/2010 ExitCare Patient Information 2012 [employer]. 

## 2011-05-14 NOTE — ED Notes (Signed)
Pt reports falling on tiles floor last night at 10:00 pm striking left side of abdomen. She states she has has pain in left mid and low abdomen since fall and had difficulty sleeping due to pain.

## 2011-05-14 NOTE — Progress Notes (Signed)
Fall last night tripped and fell against lower abdominal pain, no vaginal bleeding.

## 2011-05-18 ENCOUNTER — Encounter (INDEPENDENT_AMBULATORY_CARE_PROVIDER_SITE_OTHER): Payer: Medicaid Other

## 2011-05-18 DIAGNOSIS — Z331 Pregnant state, incidental: Secondary | ICD-10-CM

## 2011-06-16 ENCOUNTER — Encounter (INDEPENDENT_AMBULATORY_CARE_PROVIDER_SITE_OTHER): Payer: Medicaid Other | Admitting: Obstetrics and Gynecology

## 2011-06-16 ENCOUNTER — Encounter (INDEPENDENT_AMBULATORY_CARE_PROVIDER_SITE_OTHER): Payer: Medicaid Other

## 2011-06-16 DIAGNOSIS — Z1389 Encounter for screening for other disorder: Secondary | ICD-10-CM

## 2011-06-16 DIAGNOSIS — F1920 Other psychoactive substance dependence, uncomplicated: Secondary | ICD-10-CM

## 2011-06-16 DIAGNOSIS — Z331 Pregnant state, incidental: Secondary | ICD-10-CM

## 2011-06-16 DIAGNOSIS — O99330 Smoking (tobacco) complicating pregnancy, unspecified trimester: Secondary | ICD-10-CM

## 2011-07-14 ENCOUNTER — Encounter: Payer: Medicaid Other | Admitting: Obstetrics and Gynecology

## 2011-07-14 ENCOUNTER — Other Ambulatory Visit: Payer: Medicaid Other

## 2011-07-18 ENCOUNTER — Encounter: Payer: Self-pay | Admitting: Obstetrics and Gynecology

## 2011-07-18 ENCOUNTER — Ambulatory Visit (INDEPENDENT_AMBULATORY_CARE_PROVIDER_SITE_OTHER): Payer: Medicaid Other | Admitting: Obstetrics and Gynecology

## 2011-07-18 ENCOUNTER — Other Ambulatory Visit: Payer: Medicaid Other

## 2011-07-18 ENCOUNTER — Telehealth: Payer: Self-pay

## 2011-07-18 VITALS — BP 100/68 | Wt 215.0 lb

## 2011-07-18 DIAGNOSIS — R12 Heartburn: Secondary | ICD-10-CM

## 2011-07-18 DIAGNOSIS — Z331 Pregnant state, incidental: Secondary | ICD-10-CM

## 2011-07-18 HISTORY — DX: Pregnant state, incidental: Z33.1

## 2011-07-18 LAB — HEMOGLOBIN: Hemoglobin: 10 g/dL — ABNORMAL LOW (ref 12.0–15.0)

## 2011-07-18 LAB — RPR

## 2011-07-18 LAB — GLUCOSE TOLERANCE, 1 HOUR: Glucose, 1 Hour GTT: 134 mg/dL (ref 70–140)

## 2011-07-18 MED ORDER — FAMOTIDINE 40 MG PO TABS: 40.0000 mg | ORAL_TABLET | Freq: Every day | ORAL | Status: DC

## 2011-07-18 NOTE — Telephone Encounter (Deleted)
A user error has taken place: encounter opened in error, closed for administrative reasons.

## 2011-07-18 NOTE — Progress Notes (Signed)
C/o heartburn prevents sleeping reqs rx, c/o carpule tunnel both hands, & anal pressure 1 gtt given today without difficultly

## 2011-07-18 NOTE — Progress Notes (Signed)
Patient ID: NECHAMA ESCUTIA, female   DOB: 1985-11-26, 26 y.o.   MRN: [id number] Requests 3 D [place] discussed no indication for additional [place] and would have to pay for it. C/O heartburn no relief with zantac or diet or positioning, c/o of pressure on butt O EGBUS perineum WNL     retcum apprears WNL no hemorrhoids A 26 week IUP P Revie, reviewed diet excessive wt gain, nutrition, exercise, reviewed s/s preterm labor, srom, vag bleeding, kick counts to report, enc 8 water daily and frequent voids 1gtt, RPR, HGB today Lavera Guise, CNM

## 2011-07-18 NOTE — Telephone Encounter (Signed)
A user error has taken place: encounter opened in error, closed for administrative reasons.

## 2011-07-18 NOTE — Progress Notes (Signed)
Addended by: Janeece Agee on: 07/18/2011 09:50 AM   Modules accepted: Orders

## 2011-08-01 ENCOUNTER — Encounter: Payer: Self-pay | Admitting: Obstetrics and Gynecology

## 2011-08-01 ENCOUNTER — Ambulatory Visit (INDEPENDENT_AMBULATORY_CARE_PROVIDER_SITE_OTHER): Payer: Medicaid Other | Admitting: Obstetrics and Gynecology

## 2011-08-01 VITALS — BP 112/60 | Temp 98.2°F | Wt 216.0 lb

## 2011-08-01 DIAGNOSIS — Z331 Pregnant state, incidental: Secondary | ICD-10-CM

## 2011-08-01 NOTE — Progress Notes (Signed)
Pt c/o of ear pain Temp 98.8 Physical Examination: General appearance - alert, well appearing, and in no distress Eyes - pupils equal and reactive, extraocular eye movements intact Ears - bilateral TM's and external ear canals normal, left ear normal, ceruminosis noted bilaterally.  Left ear draining with large amount of wax build up Try benadryl for three days and drain ear in the shower if no relief refer to ENT [facility] Pt desires to work only three days a weeks secondary to it feels to hard to work the entire week. Note given Nose - normal and patent, no erythema, discharge or polyps Mouth - mucous membranes moist, pharynx normal without lesions Neck - supple, no significant adenopathy

## 2011-08-12 ENCOUNTER — Encounter: Payer: Self-pay | Admitting: Obstetrics and Gynecology

## 2011-08-15 ENCOUNTER — Encounter: Payer: Medicaid Other | Admitting: Obstetrics and Gynecology

## 2011-08-16 ENCOUNTER — Ambulatory Visit (INDEPENDENT_AMBULATORY_CARE_PROVIDER_SITE_OTHER): Payer: Medicaid Other | Admitting: Obstetrics and Gynecology

## 2011-08-16 ENCOUNTER — Encounter: Payer: Self-pay | Admitting: Obstetrics and Gynecology

## 2011-08-16 VITALS — BP 124/56 | Wt 221.0 lb

## 2011-08-16 DIAGNOSIS — R12 Heartburn: Secondary | ICD-10-CM

## 2011-08-16 DIAGNOSIS — Z331 Pregnant state, incidental: Secondary | ICD-10-CM

## 2011-08-16 MED ORDER — FAMOTIDINE 40 MG PO TABS: 40.0000 mg | ORAL_TABLET | Freq: Every day | ORAL | Status: DC

## 2011-08-16 NOTE — Progress Notes (Signed)
No concerns today 

## 2011-08-16 NOTE — Progress Notes (Signed)
[redacted]w[redacted]d Doing well, wants to start maternity leave at 33wks, works at [employer], plans epidural Will get [place] for S>D rv'd [facility] and PTL sx's

## 2011-08-16 NOTE — Patient Instructions (Signed)
Preventing Preterm Labor Preterm labor is when a pregnant woman has contractions that cause the cervix to open, shorten, and thin before 37 weeks of pregnancy. You will have regular contractions (tightening) 2 to 3 minutes apart. This usually causes discomfort or pain. HOME CARE  Eat a healthy diet.   Take your vitamins as told by your doctor.   Drink enough fluids to keep your pee (urine) clear or pale yellow every day.   Get rest and sleep.   Do not have sex if you are at high risk for preterm labor.   Follow your doctor's advice about activity, medicines, and tests.   Avoid stress.   Avoid hard labor or exercise that lasts for a long time.   Do not smoke.  GET HELP RIGHT AWAY IF:   You are having contractions.   You have belly (abdominal) pain.   You have bleeding from your vagina.   You have pain when you pee (urinate).   You have abnormal discharge from your vagina.   You have a temperature by mouth above 102 F (38.9 C).  MAKE SURE YOU:  Understand these instructions.   Will watch your condition.   Will get help if you are not doing well or get worse.  Document Released: 05/27/2008 Document Revised: 02/17/2011 Document Reviewed: 05/27/2008 ExitCare Patient Information 2012 [employer].Fetal Movement Counts Patient Name: __________________________________________________ Patient Due Date: ____________________ Kick counts is highly recommended in high risk pregnancies, but it is a good idea for every pregnant woman to do. Start counting fetal movements at 28 weeks of the pregnancy. Fetal movements increase after eating a full meal or eating or drinking something sweet (the blood sugar is higher). It is also important to drink plenty of fluids (well hydrated) before doing the count. Lie on your left side because it helps with the circulation or you can sit in a comfortable chair with your arms over your belly (abdomen) with no distractions around you. DOING THE  COUNT  Try to do the count the same time of day each time you do it.   Mark the day and time, then see how long it takes for you to feel 10 movements (kicks, flutters, swishes, rolls). You should have at least 10 movements within 2 hours. You will most likely feel 10 movements in much less than 2 hours. If you do not, wait an hour and count again. After a couple of days you will see a pattern.   What you are looking for is a change in the pattern or not enough counts in 2 hours. Is it taking longer in time to reach 10 movements?  SEEK MEDICAL CARE IF:  You feel less than 10 counts in 2 hours. Tried twice.   No movement in one hour.   The pattern is changing or taking longer each day to reach 10 counts in 2 hours.   You feel the baby is not moving as it usually does.  Date: ____________ Movements: ____________ Start time: ____________ Finish time: ____________  Date: ____________ Movements: ____________ Start time: ____________ Finish time: ____________ Date: ____________ Movements: ____________ Start time: ____________ Finish time: ____________ Date: ____________ Movements: ____________ Start time: ____________ Finish time: ____________ Date: ____________ Movements: ____________ Start time: ____________ Finish time: ____________ Date: ____________ Movements: ____________ Start time: ____________ Finish time: ____________ Date: ____________ Movements: ____________ Start time: ____________ Finish time: ____________ Date: ____________ Movements: ____________ Start time: ____________ Finish time: ____________  Date: ____________ Movements: ____________ Start time: ____________ Finish time:   ____________ Date: ____________ Movements: ____________ Start time: ____________ Finish time: ____________ Date: ____________ Movements: ____________ Start time: ____________ Finish time: ____________ Date: ____________ Movements: ____________ Start time: ____________ Finish time: ____________ Date:  ____________ Movements: ____________ Start time: ____________ Finish time: ____________ Date: ____________ Movements: ____________ Start time: ____________ Finish time: ____________ Date: ____________ Movements: ____________ Start time: ____________ Finish time: ____________  Date: ____________ Movements: ____________ Start time: ____________ Finish time: ____________ Date: ____________ Movements: ____________ Start time: ____________ Finish time: ____________ Date: ____________ Movements: ____________ Start time: ____________ Finish time: ____________ Date: ____________ Movements: ____________ Start time: ____________ Finish time: ____________ Date: ____________ Movements: ____________ Start time: ____________ Finish time: ____________ Date: ____________ Movements: ____________ Start time: ____________ Finish time: ____________ Date: ____________ Movements: ____________ Start time: ____________ Finish time: ____________  Date: ____________ Movements: ____________ Start time: ____________ Finish time: ____________ Date: ____________ Movements: ____________ Start time: ____________ Finish time: ____________ Date: ____________ Movements: ____________ Start time: ____________ Finish time: ____________ Date: ____________ Movements: ____________ Start time: ____________ Finish time: ____________ Date: ____________ Movements: ____________ Start time: ____________ Finish time: ____________ Date: ____________ Movements: ____________ Start time: ____________ Finish time: ____________ Date: ____________ Movements: ____________ Start time: ____________ Finish time: ____________  Date: ____________ Movements: ____________ Start time: ____________ Finish time: ____________ Date: ____________ Movements: ____________ Start time: ____________ Finish time: ____________ Date: ____________ Movements: ____________ Start time: ____________ Finish time: ____________ Date: ____________ Movements: ____________ Start  time: ____________ Finish time: ____________ Date: ____________ Movements: ____________ Start time: ____________ Finish time: ____________ Date: ____________ Movements: ____________ Start time: ____________ Finish time: ____________ Date: ____________ Movements: ____________ Start time: ____________ Finish time: ____________  Date: ____________ Movements: ____________ Start time: ____________ Finish time: ____________ Date: ____________ Movements: ____________ Start time: ____________ Finish time: ____________ Date: ____________ Movements: ____________ Start time: ____________ Finish time: ____________ Date: ____________ Movements: ____________ Start time: ____________ Finish time: ____________ Date: ____________ Movements: ____________ Start time: ____________ Finish time: ____________ Date: ____________ Movements: ____________ Start time: ____________ Finish time: ____________ Date: ____________ Movements: ____________ Start time: ____________ Finish time: ____________  Date: ____________ Movements: ____________ Start time: ____________ Finish time: ____________ Date: ____________ Movements: ____________ Start time: ____________ Finish time: ____________ Date: ____________ Movements: ____________ Start time: ____________ Finish time: ____________ Date: ____________ Movements: ____________ Start time: ____________ Finish time: ____________ Date: ____________ Movements: ____________ Start time: ____________ Finish time: ____________ Date: ____________ Movements: ____________ Start time: ____________ Finish time: ____________ Date: ____________ Movements: ____________ Start time: ____________ Finish time: ____________  Date: ____________ Movements: ____________ Start time: ____________ Finish time: ____________ Date: ____________ Movements: ____________ Start time: ____________ Finish time: ____________ Date: ____________ Movements: ____________ Start time: ____________ Finish time:  ____________ Date: ____________ Movements: ____________ Start time: ____________ Finish time: ____________ Date: ____________ Movements: ____________ Start time: ____________ Finish time: ____________ Date: ____________ Movements: ____________ Start time: ____________ Finish time: ____________ Document Released: 03/30/2006 Document Revised: 02/17/2011 Document Reviewed: 09/30/2008 ExitCare Patient Information 2012 [employer]. 

## 2011-08-30 ENCOUNTER — Encounter: Payer: Self-pay | Admitting: Obstetrics and Gynecology

## 2011-08-30 ENCOUNTER — Ambulatory Visit (INDEPENDENT_AMBULATORY_CARE_PROVIDER_SITE_OTHER): Payer: Medicaid Other | Admitting: Obstetrics and Gynecology

## 2011-08-30 ENCOUNTER — Ambulatory Visit (INDEPENDENT_AMBULATORY_CARE_PROVIDER_SITE_OTHER): Payer: Medicaid Other

## 2011-08-30 VITALS — BP 108/58 | Wt 228.0 lb

## 2011-08-30 DIAGNOSIS — L293 Anogenital pruritus, unspecified: Secondary | ICD-10-CM

## 2011-08-30 DIAGNOSIS — Z331 Pregnant state, incidental: Secondary | ICD-10-CM

## 2011-08-30 DIAGNOSIS — Z3689 Encounter for other specified antenatal screening: Secondary | ICD-10-CM

## 2011-08-30 DIAGNOSIS — L292 Pruritus vulvae: Secondary | ICD-10-CM

## 2011-08-30 LAB — POCT WET PREP (WET MOUNT): Clue Cells Wet Prep Whiff POC: NEGATIVE

## 2011-08-30 MED ORDER — NYSTATIN-TRIAMCINOLONE 100000-0.1 UNIT/GM-% EX OINT: TOPICAL_OINTMENT | Freq: Two times a day (BID) | CUTANEOUS | Status: DC

## 2011-08-30 NOTE — Progress Notes (Signed)
Pt without complaints today

## 2011-08-30 NOTE — Progress Notes (Signed)
Ultrasound: Single gestation, vertex, normal fluid, normal placenta, 32 weeks and 6 days (66 percentile), cervix 3.49 cm. Patient complains of vulvar itching.  Wet prep: Negative for yeast. We'll try Mycolog ointment for 7 nights. Return office in 2 weeks. Dr. Stefano Gaul

## 2011-08-31 LAB — US OB COMP + 14 WK

## 2011-09-09 ENCOUNTER — Encounter: Payer: Self-pay | Admitting: Obstetrics and Gynecology

## 2011-09-09 DIAGNOSIS — B9689 Other specified bacterial agents as the cause of diseases classified elsewhere: Secondary | ICD-10-CM | POA: Insufficient documentation

## 2011-09-09 DIAGNOSIS — B379 Candidiasis, unspecified: Secondary | ICD-10-CM | POA: Insufficient documentation

## 2011-09-09 DIAGNOSIS — Z8619 Personal history of other infectious and parasitic diseases: Secondary | ICD-10-CM | POA: Insufficient documentation

## 2011-09-09 DIAGNOSIS — Z8742 Personal history of other diseases of the female genital tract: Secondary | ICD-10-CM | POA: Insufficient documentation

## 2011-09-12 ENCOUNTER — Ambulatory Visit (INDEPENDENT_AMBULATORY_CARE_PROVIDER_SITE_OTHER): Payer: Medicaid Other | Admitting: Obstetrics and Gynecology

## 2011-09-12 VITALS — BP 110/64 | Wt 229.0 lb

## 2011-09-12 DIAGNOSIS — Z331 Pregnant state, incidental: Secondary | ICD-10-CM

## 2011-09-12 NOTE — Progress Notes (Signed)
Doing well. Possible viral illness.  Drink plenty of fluids and get rest. Return office in 2 weeks. Dr. Stefano Gaul

## 2011-09-12 NOTE — Progress Notes (Signed)
C/o upset stomach

## 2011-09-26 ENCOUNTER — Ambulatory Visit (INDEPENDENT_AMBULATORY_CARE_PROVIDER_SITE_OTHER): Payer: Medicaid Other | Admitting: Obstetrics and Gynecology

## 2011-09-26 ENCOUNTER — Encounter: Payer: Self-pay | Admitting: Obstetrics and Gynecology

## 2011-09-26 VITALS — BP 124/62 | Wt 229.0 lb

## 2011-09-26 DIAGNOSIS — Z331 Pregnant state, incidental: Secondary | ICD-10-CM

## 2011-09-26 NOTE — Addendum Note (Signed)
Addended by: Tim Lair on: 09/26/2011 04:26 PM   Modules accepted: Orders

## 2011-09-26 NOTE — Progress Notes (Signed)
Beta strep, GC, Chlamydia today. Return to office in 1 week. Dr. Stefano Gaul

## 2011-09-26 NOTE — Progress Notes (Signed)
Pt states she has no concerns today. GBS today. Pt desires cervix check.

## 2011-09-27 LAB — GC/CHLAMYDIA PROBE AMP, GENITAL
Chlamydia, DNA Probe: NEGATIVE
GC Probe Amp, Genital: NEGATIVE

## 2011-10-05 ENCOUNTER — Encounter: Payer: Self-pay | Admitting: Obstetrics and Gynecology

## 2011-10-05 ENCOUNTER — Ambulatory Visit (INDEPENDENT_AMBULATORY_CARE_PROVIDER_SITE_OTHER): Payer: Medicaid Other | Admitting: Obstetrics and Gynecology

## 2011-10-05 VITALS — BP 122/64 | Wt 238.0 lb

## 2011-10-05 DIAGNOSIS — Z331 Pregnant state, incidental: Secondary | ICD-10-CM

## 2011-10-05 NOTE — Progress Notes (Signed)
Pt c/o heartburn also c/o SOB request cervix check

## 2011-10-05 NOTE — Progress Notes (Signed)
Pepcid 40mg  called into pt pharm at 5:46pm Per [place] Clock.

## 2011-10-05 NOTE — Progress Notes (Signed)
GC negative, Chlamydia negative, beta strep negative, Return to office in 1 week Dr. Stefano Gaul

## 2011-10-13 ENCOUNTER — Ambulatory Visit (INDEPENDENT_AMBULATORY_CARE_PROVIDER_SITE_OTHER): Payer: Medicaid Other | Admitting: Obstetrics and Gynecology

## 2011-10-13 ENCOUNTER — Encounter: Payer: Self-pay | Admitting: Obstetrics and Gynecology

## 2011-10-13 ENCOUNTER — Ambulatory Visit: Payer: Medicaid Other | Admitting: Obstetrics and Gynecology

## 2011-10-13 VITALS — BP 110/62 | Wt 236.0 lb

## 2011-10-13 DIAGNOSIS — Z331 Pregnant state, incidental: Secondary | ICD-10-CM

## 2011-10-13 NOTE — Progress Notes (Signed)
Doing well. Return office in 1 week. Dr. Shallen Luedke 

## 2011-10-13 NOTE — Progress Notes (Signed)
C/o SOB. Request cervix check.

## 2011-10-21 ENCOUNTER — Telehealth: Payer: Self-pay | Admitting: Obstetrics and Gynecology

## 2011-10-21 ENCOUNTER — Encounter: Payer: Medicaid Other | Admitting: Obstetrics and Gynecology

## 2011-10-21 NOTE — Telephone Encounter (Signed)
TC from pt. States is having painless contractions q 6 min x 45 min. +FM. Mucousy vag D/C.  Rescheduled appt she had today until 10/24/11.  Is currently in [place], [place]. Has only had one soda to drink today.  No water.  Advised to drink 32 oz water now. To call if contractions comtinue and are q 5 min or less x 1 hr, with watery vaginal D/C, bleeding or decreased FM.  REviewed instructions several times. Pt states is returnign to [place] soon.  Pt verbalizes comprehension of instructions.

## 2011-10-24 ENCOUNTER — Encounter: Payer: Self-pay | Admitting: Obstetrics and Gynecology

## 2011-10-24 ENCOUNTER — Ambulatory Visit (INDEPENDENT_AMBULATORY_CARE_PROVIDER_SITE_OTHER): Payer: Medicaid Other | Admitting: Obstetrics and Gynecology

## 2011-10-24 VITALS — BP 116/58 | Wt 238.0 lb

## 2011-10-24 DIAGNOSIS — Z3A41 41 weeks gestation of pregnancy: Secondary | ICD-10-CM

## 2011-10-24 DIAGNOSIS — O480 Post-term pregnancy: Secondary | ICD-10-CM

## 2011-10-24 DIAGNOSIS — Z331 Pregnant state, incidental: Secondary | ICD-10-CM

## 2011-10-24 NOTE — Progress Notes (Signed)
Pt states it hurts to walk and that she is in a lot of pain, requests cervix check.

## 2011-10-25 ENCOUNTER — Telehealth: Payer: Self-pay | Admitting: Obstetrics and Gynecology

## 2011-10-25 ENCOUNTER — Inpatient Hospital Stay (HOSPITAL_COMMUNITY)
Admission: AD | Admit: 2011-10-25 | Discharge: 2011-10-29 | DRG: 765 | Disposition: A | Discharge status: Home / Self Care | Payer: Medicaid Other | Source: Ambulatory Visit | Attending: Obstetrics and Gynecology | Admitting: Obstetrics and Gynecology

## 2011-10-25 ENCOUNTER — Encounter (HOSPITAL_COMMUNITY): Payer: Self-pay | Admitting: *Deleted

## 2011-10-25 DIAGNOSIS — O9903 Anemia complicating the puerperium: Secondary | ICD-10-CM | POA: Diagnosis not present

## 2011-10-25 DIAGNOSIS — Z98891 History of uterine scar from previous surgery: Secondary | ICD-10-CM | POA: Diagnosis not present

## 2011-10-25 DIAGNOSIS — D649 Anemia, unspecified: Secondary | ICD-10-CM | POA: Diagnosis not present

## 2011-10-25 DIAGNOSIS — R12 Heartburn: Secondary | ICD-10-CM

## 2011-10-25 DIAGNOSIS — R000 Tachycardia, unspecified: Secondary | ICD-10-CM | POA: Diagnosis present

## 2011-10-25 DIAGNOSIS — O99892 Other specified diseases and conditions complicating childbirth: Secondary | ICD-10-CM | POA: Diagnosis present

## 2011-10-25 HISTORY — DX: Acute vaginitis: N76.0

## 2011-10-25 HISTORY — DX: Other specified bacterial agents as the cause of diseases classified elsewhere: B96.89

## 2011-10-25 LAB — TYPE AND SCREEN: ABO/RH(D): A POS

## 2011-10-25 LAB — OB RESULTS CONSOLE RPR: RPR: NONREACTIVE

## 2011-10-25 LAB — CBC
HCT: 34.9 % — ABNORMAL LOW (ref 36.0–46.0)
Hemoglobin: 11.5 g/dL — ABNORMAL LOW (ref 12.0–15.0)
MCHC: 33 g/dL (ref 30.0–36.0)
MCV: 80 fL (ref 78.0–100.0)
RDW: 15.2 % (ref 11.5–15.5)

## 2011-10-25 LAB — OB RESULTS CONSOLE HIV ANTIBODY (ROUTINE TESTING): HIV: NONREACTIVE

## 2011-10-25 MED ORDER — OXYTOCIN 40 UNITS IN LACTATED RINGERS INFUSION - SIMPLE MED
62.5000 mL/h | Freq: Once | INTRAVENOUS | Status: DC
  Filled 2011-10-25: qty 1000

## 2011-10-25 MED ORDER — LACTATED RINGERS IV SOLN: 500.0000 mL | Freq: Once | INTRAVENOUS | Status: DC

## 2011-10-25 MED ORDER — ACETAMINOPHEN 325 MG PO TABS
650.0000 mg | ORAL_TABLET | ORAL | Status: DC | PRN
  Administered 2011-10-26: 650 mg via ORAL
  Filled 2011-10-25: qty 2

## 2011-10-25 MED ORDER — LACTATED RINGERS IV SOLN
INTRAVENOUS | Status: DC
  Administered 2011-10-25 – 2011-10-26 (×4): via INTRAVENOUS

## 2011-10-25 MED ORDER — LIDOCAINE HCL (PF) 1 % IJ SOLN
30.0000 mL | INTRAMUSCULAR | Status: DC | PRN
  Filled 2011-10-25: qty 30

## 2011-10-25 MED ORDER — FENTANYL 2.5 MCG/ML BUPIVACAINE 1/10 % EPIDURAL INFUSION (WH - ANES)
14.0000 mL/h | INTRAMUSCULAR | Status: DC
  Administered 2011-10-25 – 2011-10-26 (×3): 14 mL/h via EPIDURAL
  Filled 2011-10-25 (×3): qty 60

## 2011-10-25 MED ORDER — EPHEDRINE 5 MG/ML INJ: 10.0000 mg | INTRAVENOUS | Status: DC | PRN

## 2011-10-25 MED ORDER — LACTATED RINGERS IV SOLN
500.0000 mL | INTRAVENOUS | Status: DC | PRN
  Administered 2011-10-26: 300 mL via INTRAVENOUS
  Administered 2011-10-26: 500 mL via INTRAVENOUS

## 2011-10-25 MED ORDER — IBUPROFEN 600 MG PO TABS: 600.0000 mg | ORAL_TABLET | Freq: Four times a day (QID) | ORAL | Status: DC | PRN

## 2011-10-25 MED ORDER — PHENYLEPHRINE 40 MCG/ML (10ML) SYRINGE FOR IV PUSH (FOR BLOOD PRESSURE SUPPORT): 80.0000 ug | PREFILLED_SYRINGE | INTRAVENOUS | Status: DC | PRN

## 2011-10-25 MED ORDER — LIDOCAINE HCL (PF) 1 % IJ SOLN
INTRAMUSCULAR | Status: DC | PRN
  Administered 2011-10-25 (×2): 5 mL

## 2011-10-25 MED ORDER — EPHEDRINE 5 MG/ML INJ
10.0000 mg | INTRAVENOUS | Status: DC | PRN
  Filled 2011-10-25: qty 4

## 2011-10-25 MED ORDER — PHENYLEPHRINE 40 MCG/ML (10ML) SYRINGE FOR IV PUSH (FOR BLOOD PRESSURE SUPPORT)
80.0000 ug | PREFILLED_SYRINGE | INTRAVENOUS | Status: DC | PRN
  Filled 2011-10-25: qty 5

## 2011-10-25 MED ORDER — DIPHENHYDRAMINE HCL 50 MG/ML IJ SOLN: 12.5000 mg | INTRAMUSCULAR | Status: DC | PRN

## 2011-10-25 MED ORDER — FENTANYL 2.5 MCG/ML BUPIVACAINE 1/10 % EPIDURAL INFUSION (WH - ANES): 14.0000 mL/h | INTRAMUSCULAR | Status: DC

## 2011-10-25 MED ORDER — CITRIC ACID-SODIUM CITRATE 334-500 MG/5ML PO SOLN
30.0000 mL | ORAL | Status: DC | PRN
  Administered 2011-10-26: 30 mL via ORAL
  Filled 2011-10-25: qty 15

## 2011-10-25 MED ORDER — FLEET ENEMA 7-19 GM/118ML RE ENEM: 1.0000 | ENEMA | RECTAL | Status: DC | PRN

## 2011-10-25 MED ORDER — ONDANSETRON HCL 4 MG/2ML IJ SOLN: 4.0000 mg | Freq: Four times a day (QID) | INTRAMUSCULAR | Status: DC | PRN

## 2011-10-25 MED ORDER — OXYCODONE-ACETAMINOPHEN 5-325 MG PO TABS: 1.0000 | ORAL_TABLET | ORAL | Status: DC | PRN

## 2011-10-25 MED ORDER — OXYTOCIN BOLUS FROM INFUSION
250.0000 mL | Freq: Once | INTRAVENOUS | Status: DC
  Filled 2011-10-25: qty 500

## 2011-10-25 MED ORDER — FENTANYL CITRATE 0.05 MG/ML IJ SOLN
100.0000 ug | INTRAMUSCULAR | Status: DC | PRN
  Administered 2011-10-25 (×2): 100 ug via INTRAVENOUS
  Filled 2011-10-25 (×2): qty 2

## 2011-10-25 NOTE — Progress Notes (Signed)
Ultrasound and biophysical profile at 41 weeks. Induction at 42 weeks if the cervix is favorable. Return office in 1 week. Dr. Stefano Gaul

## 2011-10-25 NOTE — Progress Notes (Signed)
Pt states her contractions are an  8 in between she is not feeling anything

## 2011-10-25 NOTE — Telephone Encounter (Signed)
Angela Brock/ob/contractions

## 2011-10-25 NOTE — Progress Notes (Signed)
Comfortable, some pressure O VSS      Fhts category 1      Abd soft between uc      Contractions 1: 2- 3 mins       Vag examination: deferred as patient resting with IV sedation Fentanyl x 1 dose. A  Labor with IV Analgresia P continue care and to re evaluate prior to next request for medication.  Earl Gala, CNM.

## 2011-10-25 NOTE — Anesthesia Preprocedure Evaluation (Signed)
Anesthesia Evaluation  Patient identified by MRN, date of birth, ID band Patient awake    Reviewed: Allergy & Precautions, H&P , Patient's Chart, lab work & pertinent test results  Airway Mallampati: II TM Distance: >3 FB Neck ROM: full    Dental No notable dental hx.    Pulmonary neg pulmonary ROS,  breath sounds clear to auscultation  Pulmonary exam normal       Cardiovascular negative cardio ROS  Rhythm:regular Rate:Normal     Neuro/Psych  Headaches, negative neurological ROS  negative psych ROS   GI/Hepatic negative GI ROS, Neg liver ROS,   Endo/Other  negative endocrine ROSMorbid obesity  Renal/GU negative Renal ROS     Musculoskeletal   Abdominal   Peds  Hematology negative hematology ROS (+)   Anesthesia Other Findings Normal pregnancy, incidental 07/18/2011   Abnormal Pap smear   age 26     Headache     Hx of chlamydia infection   26 years old    H/O menorrhagia     Hx of dysmenorrhea        BV (bacterial vaginosis)    Reproductive/Obstetrics (+) Pregnancy                           Anesthesia Physical Anesthesia Plan  ASA: III  Anesthesia Plan: Epidural   Post-op Pain Management:    Induction:   Airway Management Planned:   Additional Equipment:   Intra-op Plan:   Post-operative Plan:   Informed Consent: I have reviewed the patients History and Physical, chart, labs and discussed the procedure including the risks, benefits and alternatives for the proposed anesthesia with the patient or authorized representative who has indicated his/her understanding and acceptance.     Plan Discussed with:   Anesthesia Plan Comments:         Anesthesia Quick Evaluation

## 2011-10-25 NOTE — H&P (Signed)
Angela Brock is a 26 y.o. female presenting for evaluation of contractions and assessment in labor. Maternal Medical History:  Reason for admission: Reason for admission: contractions.  Started contractions at 11am  Contractions: Onset was 3-5 hours ago.      OB History    Grav Para Term Preterm Abortions TAB SAB Ect Mult Living   1              Past Medical History  Diagnosis Date  . Normal pregnancy, incidental 07/18/2011  . Abnormal Pap smear     age 52   . Headache   . Hx of chlamydia infection     26 years old  . H/O menorrhagia   . Hx of dysmenorrhea    Past Surgical History  Procedure Date  . No past surgeries    Family History: family history includes Alcohol abuse in her maternal grandfather, maternal grandmother, and paternal grandfather; Depression in her mother; Hypertension in her mother; and Migraines in her mother.  There is no history of Anesthesia problems. Social History:  reports that she has quit smoking. She has never used smokeless tobacco. She reports that she does not drink alcohol or use illicit drugs.   Prenatal Transfer Tool  Maternal Diabetes: No Genetic Screening: Normal Maternal Ultrasounds/Referrals: Normal Fetal Ultrasounds or other Referrals:  None Maternal Substance Abuse:  No Significant Maternal Medications:  None Significant Maternal Lab Results:  None Other Comments:  None  ROS  Affect: Alert AAO x3 Lungs: Bilat clear CV: RRR Abdomen: gravid and non tender GI: normal GU: normal No PV bleeding. Contracting 1: 2 -  Extremities: no edema.   Dilation: 3 Effacement (%): 80 Station: -2 Last menstrual period 01/14/2011. Exam Physical Exam   Prenatal labs: ABO, Rh: --/--/A POS (12/08 1210) Antibody:  neg Rubella:  Immune RPR: NON REAC (05/06 0938)  HBsAg:   heg HIV:   neg GBS: NEGATIVE (07/15 1627)      Assessment/Plan:  Patient has been having contraction for the past week. Started to contract more  regularly since 11.00 hrs and unable to talk when having a contraction. Breathing through each contraction and coping well. Evaluation for onset of labor. SVE: 3/80/-2, Soft, Contractions every 2 - 3 mins palpate moderate. FHT's baseline: 150 bpm Cat 1 Tracing. Patient has states that she does not desire Epidural. Admission for labor management  in collaboration with Dr Su Hilt. Transfer to [facility]. Patient is accompanied by her mother and a friend.  Earl Gala, CNM 10/25/2011, 4:15 PM

## 2011-10-25 NOTE — MAU Note (Signed)
Pt contracting 2 qmins pt states she has been having pain since 11:00am

## 2011-10-25 NOTE — Anesthesia Procedure Notes (Signed)
Epidural Patient location during procedure: OB Start time: 10/25/2011 9:01 PM  Staffing Anesthesiologist: Brayton Caves R Performed by: anesthesiologist   Preanesthetic Checklist Completed: patient identified, site marked, surgical consent, pre-op evaluation, timeout performed, IV checked, risks and benefits discussed and monitors and equipment checked  Epidural Patient position: sitting Prep: site prepped and draped and DuraPrep Patient monitoring: continuous pulse ox and blood pressure Approach: midline Injection technique: LOR air and LOR saline  Needle:  Needle type: Tuohy  Needle gauge: 17 G Needle length: 9 cm Needle insertion depth: 7 cm Catheter type: closed end flexible Catheter size: 19 Gauge Catheter at skin depth: 12 cm Test dose: negative  Assessment Events: blood not aspirated, injection not painful, no injection resistance, negative IV test and no paresthesia  Additional Notes Patient identified.  Risk benefits discussed including failed block, incomplete pain control, headache, nerve damage, paralysis, blood pressure changes, nausea, vomiting, reactions to medication both toxic or allergic, and postpartum back pain.  Patient expressed understanding and wished to proceed.  All questions were answered.  Sterile technique used throughout procedure and epidural site dressed with sterile barrier dressing. No paresthesia or other complications noted.The patient did not experience any signs of intravascular injection such as tinnitus or metallic taste in mouth nor signs of intrathecal spread such as rapid motor block. Please see nursing notes for vital signs.

## 2011-10-25 NOTE — Telephone Encounter (Signed)
Pt called, is 40 wks 4 days, states has been having ctxs about 1-3 minutes apart per pt but inconsistent with the length of the contraction, (while on the phone pt had a 10 second contraction), pt does have +fm, denies bldg or LOF, advised pt to monitor for a little longer and call back.  Pt's mother called back states contractions have gotten closer together, contractions are 3-4 minutes apart latsting 90-120 seconds.  DD made aware that pt on her way to [facility], all pertinent information given.

## 2011-10-26 ENCOUNTER — Encounter (HOSPITAL_COMMUNITY): Payer: Self-pay | Admitting: Anesthesiology

## 2011-10-26 ENCOUNTER — Inpatient Hospital Stay (HOSPITAL_COMMUNITY): Payer: Medicaid Other | Admitting: Anesthesiology

## 2011-10-26 ENCOUNTER — Encounter (HOSPITAL_COMMUNITY): Payer: Self-pay

## 2011-10-26 ENCOUNTER — Encounter (HOSPITAL_COMMUNITY)
Admission: AD | Disposition: A | Discharge status: Home / Self Care | Payer: Self-pay | Source: Ambulatory Visit | Attending: Obstetrics and Gynecology

## 2011-10-26 SURGERY — Surgical Case
Anesthesia: Regional | Site: Abdomen | Wound class: Clean Contaminated

## 2011-10-26 MED ORDER — IBUPROFEN 600 MG PO TABS
600.0000 mg | ORAL_TABLET | Freq: Four times a day (QID) | ORAL | Status: DC
  Administered 2011-10-26 – 2011-10-29 (×11): 600 mg via ORAL
  Filled 2011-10-26 (×4): qty 1

## 2011-10-26 MED ORDER — SIMETHICONE 80 MG PO CHEW
80.0000 mg | CHEWABLE_TABLET | Freq: Three times a day (TID) | ORAL | Status: DC
  Administered 2011-10-26 – 2011-10-28 (×10): 80 mg via ORAL

## 2011-10-26 MED ORDER — OXYCODONE-ACETAMINOPHEN 5-325 MG PO TABS
1.0000 | ORAL_TABLET | ORAL | Status: DC | PRN
  Administered 2011-10-27 – 2011-10-28 (×4): 2 via ORAL
  Administered 2011-10-28 – 2011-10-29 (×3): 1 via ORAL
  Filled 2011-10-26 (×4): qty 2
  Filled 2011-10-26 (×3): qty 1

## 2011-10-26 MED ORDER — MORPHINE SULFATE (PF) 0.5 MG/ML IJ SOLN
INTRAMUSCULAR | Status: DC | PRN
  Administered 2011-10-26: 1 mg via INTRAVENOUS
  Administered 2011-10-26: 4 mg via EPIDURAL

## 2011-10-26 MED ORDER — SCOPOLAMINE 1 MG/3DAYS TD PT72
1.0000 | MEDICATED_PATCH | Freq: Once | TRANSDERMAL | Status: AC
  Administered 2011-10-26: 1.5 mg via TRANSDERMAL

## 2011-10-26 MED ORDER — CEFAZOLIN SODIUM-DEXTROSE 2-3 GM-% IV SOLR
2.0000 g | Freq: Three times a day (TID) | INTRAVENOUS | Status: DC
  Administered 2011-10-26: 2 g via INTRAVENOUS
  Filled 2011-10-26 (×3): qty 50

## 2011-10-26 MED ORDER — MEPERIDINE HCL 25 MG/ML IJ SOLN
INTRAMUSCULAR | Status: AC
  Filled 2011-10-26: qty 1

## 2011-10-26 MED ORDER — LANOLIN HYDROUS EX OINT: 1.0000 "application " | TOPICAL_OINTMENT | CUTANEOUS | Status: DC | PRN

## 2011-10-26 MED ORDER — MEPERIDINE HCL 25 MG/ML IJ SOLN
INTRAMUSCULAR | Status: DC | PRN
  Administered 2011-10-26: 25 mg via INTRAVENOUS

## 2011-10-26 MED ORDER — ZOLPIDEM TARTRATE 5 MG PO TABS: 5.0000 mg | ORAL_TABLET | Freq: Every evening | ORAL | Status: DC | PRN

## 2011-10-26 MED ORDER — FLEET ENEMA 7-19 GM/118ML RE ENEM: 1.0000 | ENEMA | Freq: Every day | RECTAL | Status: DC | PRN

## 2011-10-26 MED ORDER — TETANUS-DIPHTH-ACELL PERTUSSIS 5-2.5-18.5 LF-MCG/0.5 IM SUSP
0.5000 mL | Freq: Once | INTRAMUSCULAR | Status: AC
  Administered 2011-10-28: 0.5 mL via INTRAMUSCULAR

## 2011-10-26 MED ORDER — SCOPOLAMINE 1 MG/3DAYS TD PT72
MEDICATED_PATCH | TRANSDERMAL | Status: AC
  Administered 2011-10-26: 1.5 mg via TRANSDERMAL
  Filled 2011-10-26: qty 1

## 2011-10-26 MED ORDER — NALBUPHINE HCL 10 MG/ML IJ SOLN
5.0000 mg | INTRAMUSCULAR | Status: DC | PRN
  Filled 2011-10-26: qty 1

## 2011-10-26 MED ORDER — LIDOCAINE-EPINEPHRINE (PF) 2 %-1:200000 IJ SOLN
INTRAMUSCULAR | Status: AC
  Filled 2011-10-26: qty 20

## 2011-10-26 MED ORDER — FENTANYL CITRATE 0.05 MG/ML IJ SOLN: 25.0000 ug | INTRAMUSCULAR | Status: DC | PRN

## 2011-10-26 MED ORDER — SODIUM CHLORIDE 0.9 % IJ SOLN: 3.0000 mL | INTRAMUSCULAR | Status: DC | PRN

## 2011-10-26 MED ORDER — TERBUTALINE SULFATE 1 MG/ML IJ SOLN: 0.2500 mg | Freq: Once | INTRAMUSCULAR | Status: DC | PRN

## 2011-10-26 MED ORDER — PRENATAL MULTIVITAMIN CH
1.0000 | ORAL_TABLET | Freq: Every day | ORAL | Status: DC
  Administered 2011-10-27 – 2011-10-29 (×3): 1 via ORAL
  Filled 2011-10-26 (×2): qty 1

## 2011-10-26 MED ORDER — KETOROLAC TROMETHAMINE 30 MG/ML IJ SOLN: 30.0000 mg | Freq: Four times a day (QID) | INTRAMUSCULAR | Status: AC | PRN

## 2011-10-26 MED ORDER — 0.9 % SODIUM CHLORIDE (POUR BTL) OPTIME
TOPICAL | Status: DC | PRN
  Administered 2011-10-26: 1000 mL

## 2011-10-26 MED ORDER — LACTATED RINGERS IV SOLN
INTRAVENOUS | Status: DC | PRN
  Administered 2011-10-26 (×2): via INTRAVENOUS

## 2011-10-26 MED ORDER — MEASLES, MUMPS & RUBELLA VAC ~~LOC~~ INJ: 0.5000 mL | INJECTION | Freq: Once | SUBCUTANEOUS | Status: DC

## 2011-10-26 MED ORDER — SODIUM CHLORIDE 0.9 % IV SOLN
1.0000 ug/kg/h | INTRAVENOUS | Status: DC | PRN
  Filled 2011-10-26: qty 2.5

## 2011-10-26 MED ORDER — FAMOTIDINE 20 MG PO TABS
40.0000 mg | ORAL_TABLET | Freq: Every day | ORAL | Status: DC
  Administered 2011-10-27 – 2011-10-29 (×3): 40 mg via ORAL
  Filled 2011-10-26 (×2): qty 1
  Filled 2011-10-26: qty 2
  Filled 2011-10-26: qty 1

## 2011-10-26 MED ORDER — BISACODYL 10 MG RE SUPP
10.0000 mg | Freq: Every day | RECTAL | Status: DC | PRN
  Administered 2011-10-28: 10 mg via RECTAL
  Filled 2011-10-26: qty 1

## 2011-10-26 MED ORDER — DIBUCAINE 1 % RE OINT: 1.0000 "application " | TOPICAL_OINTMENT | RECTAL | Status: DC | PRN

## 2011-10-26 MED ORDER — ONDANSETRON HCL 4 MG/2ML IJ SOLN: 4.0000 mg | INTRAMUSCULAR | Status: DC | PRN

## 2011-10-26 MED ORDER — MEPERIDINE HCL 25 MG/ML IJ SOLN: 6.2500 mg | INTRAMUSCULAR | Status: DC | PRN

## 2011-10-26 MED ORDER — EPHEDRINE 5 MG/ML INJ
INTRAVENOUS | Status: AC
  Filled 2011-10-26: qty 10

## 2011-10-26 MED ORDER — FERROUS SULFATE 325 (65 FE) MG PO TABS
325.0000 mg | ORAL_TABLET | Freq: Two times a day (BID) | ORAL | Status: DC
  Administered 2011-10-27 – 2011-10-28 (×3): 325 mg via ORAL
  Filled 2011-10-26 (×3): qty 1

## 2011-10-26 MED ORDER — LACTATED RINGERS IV SOLN
INTRAVENOUS | Status: DC
  Administered 2011-10-26 (×2): via INTRAUTERINE

## 2011-10-26 MED ORDER — DIPHENHYDRAMINE HCL 50 MG/ML IJ SOLN: 12.5000 mg | INTRAMUSCULAR | Status: DC | PRN

## 2011-10-26 MED ORDER — KETOROLAC TROMETHAMINE 30 MG/ML IJ SOLN
INTRAMUSCULAR | Status: AC
  Administered 2011-10-26: 30 mg via INTRAVENOUS
  Filled 2011-10-26: qty 1

## 2011-10-26 MED ORDER — LACTATED RINGERS IV SOLN
INTRAVENOUS | Status: DC | PRN
  Administered 2011-10-26: 09:00:00 via INTRAVENOUS

## 2011-10-26 MED ORDER — DIPHENHYDRAMINE HCL 25 MG PO CAPS: 25.0000 mg | ORAL_CAPSULE | Freq: Four times a day (QID) | ORAL | Status: DC | PRN

## 2011-10-26 MED ORDER — DIPHENHYDRAMINE HCL 25 MG PO CAPS
25.0000 mg | ORAL_CAPSULE | ORAL | Status: DC | PRN
  Administered 2011-10-27 – 2011-10-28 (×2): 25 mg via ORAL
  Filled 2011-10-26 (×3): qty 1

## 2011-10-26 MED ORDER — NALOXONE HCL 0.4 MG/ML IJ SOLN: 0.4000 mg | INTRAMUSCULAR | Status: DC | PRN

## 2011-10-26 MED ORDER — METOCLOPRAMIDE HCL 5 MG/ML IJ SOLN: 10.0000 mg | Freq: Three times a day (TID) | INTRAMUSCULAR | Status: DC | PRN

## 2011-10-26 MED ORDER — OXYTOCIN 10 UNIT/ML IJ SOLN
INTRAMUSCULAR | Status: AC
  Filled 2011-10-26: qty 4

## 2011-10-26 MED ORDER — OXYTOCIN 40 UNITS IN LACTATED RINGERS INFUSION - SIMPLE MED: 62.5000 mL/h | INTRAVENOUS | Status: AC

## 2011-10-26 MED ORDER — LACTATED RINGERS IV SOLN: INTRAVENOUS | Status: DC

## 2011-10-26 MED ORDER — MORPHINE SULFATE 0.5 MG/ML IJ SOLN
INTRAMUSCULAR | Status: AC
  Filled 2011-10-26: qty 10

## 2011-10-26 MED ORDER — EPHEDRINE SULFATE 50 MG/ML IJ SOLN
INTRAMUSCULAR | Status: DC | PRN
Start: 2011-10-26 — End: 2011-10-26
  Administered 2011-10-26: 5 mg via INTRAVENOUS

## 2011-10-26 MED ORDER — ONDANSETRON HCL 4 MG/2ML IJ SOLN: 4.0000 mg | Freq: Three times a day (TID) | INTRAMUSCULAR | Status: DC | PRN

## 2011-10-26 MED ORDER — ONDANSETRON HCL 4 MG PO TABS: 4.0000 mg | ORAL_TABLET | ORAL | Status: DC | PRN

## 2011-10-26 MED ORDER — ONDANSETRON HCL 4 MG/2ML IJ SOLN
INTRAMUSCULAR | Status: DC | PRN
  Administered 2011-10-26: 4 mg via INTRAVENOUS

## 2011-10-26 MED ORDER — OXYTOCIN 40 UNITS IN LACTATED RINGERS INFUSION - SIMPLE MED
1.0000 m[IU]/min | INTRAVENOUS | Status: DC
  Administered 2011-10-26: 1 m[IU]/min via INTRAVENOUS

## 2011-10-26 MED ORDER — OXYTOCIN 10 UNIT/ML IJ SOLN
40.0000 [IU] | INTRAVENOUS | Status: DC | PRN
  Administered 2011-10-26: 40 [IU] via INTRAVENOUS

## 2011-10-26 MED ORDER — PHENYLEPHRINE 40 MCG/ML (10ML) SYRINGE FOR IV PUSH (FOR BLOOD PRESSURE SUPPORT)
PREFILLED_SYRINGE | INTRAVENOUS | Status: AC
  Filled 2011-10-26: qty 10

## 2011-10-26 MED ORDER — IBUPROFEN 600 MG PO TABS
600.0000 mg | ORAL_TABLET | Freq: Four times a day (QID) | ORAL | Status: DC | PRN
  Filled 2011-10-26 (×7): qty 1

## 2011-10-26 MED ORDER — MENTHOL 3 MG MT LOZG: 1.0000 | LOZENGE | OROMUCOSAL | Status: DC | PRN

## 2011-10-26 MED ORDER — ONDANSETRON HCL 4 MG/2ML IJ SOLN
INTRAMUSCULAR | Status: AC
  Filled 2011-10-26: qty 2

## 2011-10-26 MED ORDER — SODIUM BICARBONATE 8.4 % IV SOLN
INTRAVENOUS | Status: AC
  Filled 2011-10-26: qty 50

## 2011-10-26 MED ORDER — METOCLOPRAMIDE HCL 5 MG/ML IJ SOLN: 10.0000 mg | Freq: Once | INTRAMUSCULAR | Status: DC | PRN

## 2011-10-26 MED ORDER — KETOROLAC TROMETHAMINE 60 MG/2ML IM SOLN
60.0000 mg | Freq: Once | INTRAMUSCULAR | Status: DC | PRN
  Filled 2011-10-26: qty 2

## 2011-10-26 MED ORDER — WITCH HAZEL-GLYCERIN EX PADS: 1.0000 "application " | MEDICATED_PAD | CUTANEOUS | Status: DC | PRN

## 2011-10-26 MED ORDER — DIPHENHYDRAMINE HCL 50 MG/ML IJ SOLN: 25.0000 mg | INTRAMUSCULAR | Status: DC | PRN

## 2011-10-26 MED ORDER — SIMETHICONE 80 MG PO CHEW: 80.0000 mg | CHEWABLE_TABLET | ORAL | Status: DC | PRN

## 2011-10-26 MED ORDER — SODIUM BICARBONATE 8.4 % IV SOLN
INTRAVENOUS | Status: DC | PRN
  Administered 2011-10-26: 5 mL via EPIDURAL

## 2011-10-26 MED ORDER — SENNOSIDES-DOCUSATE SODIUM 8.6-50 MG PO TABS
2.0000 | ORAL_TABLET | Freq: Every day | ORAL | Status: DC
  Administered 2011-10-26 – 2011-10-28 (×3): 2 via ORAL

## 2011-10-26 MED ORDER — KETOROLAC TROMETHAMINE 30 MG/ML IJ SOLN
30.0000 mg | Freq: Four times a day (QID) | INTRAMUSCULAR | Status: AC | PRN
  Administered 2011-10-26: 30 mg via INTRAVENOUS

## 2011-10-26 MED ORDER — PHENYLEPHRINE HCL 10 MG/ML IJ SOLN
INTRAMUSCULAR | Status: DC | PRN
  Administered 2011-10-26: 40 ug via INTRAVENOUS
  Administered 2011-10-26: 80 ug via INTRAVENOUS
  Administered 2011-10-26 (×7): 40 ug via INTRAVENOUS

## 2011-10-26 SURGICAL SUPPLY — 39 items
BENZOIN TINCTURE PRP APPL 2/3 (GAUZE/BANDAGES/DRESSINGS) ×2 IMPLANT
CHLORAPREP W/TINT 26ML (MISCELLANEOUS) ×2 IMPLANT
CLOTH BEACON ORANGE TIMEOUT ST (SAFETY) ×2 IMPLANT
CONTAINER PREFILL 10% NBF 15ML (MISCELLANEOUS) IMPLANT
DRAIN JACKSON PRT FLT 10 (DRAIN) IMPLANT
DRESSING TELFA 8X3 (GAUZE/BANDAGES/DRESSINGS) ×2 IMPLANT
ELECT REM PT RETURN 9FT ADLT (ELECTROSURGICAL) ×2
ELECTRODE REM PT RTRN 9FT ADLT (ELECTROSURGICAL) ×1 IMPLANT
EVACUATOR SILICONE 100CC (DRAIN) IMPLANT
EXTRACTOR VACUUM M CUP 4 TUBE (SUCTIONS) IMPLANT
GLOVE BIO SURGEON STRL SZ 6.5 (GLOVE) ×2 IMPLANT
GLOVE BIO SURGEON STRL SZ7 (GLOVE) ×2 IMPLANT
GLOVE BIOGEL PI IND STRL 7.0 (GLOVE) ×4 IMPLANT
GLOVE BIOGEL PI INDICATOR 7.0 (GLOVE) ×4
GLOVE INDICATOR 7.0 STRL GRN (GLOVE) ×2 IMPLANT
GOWN PREVENTION PLUS LG XLONG (DISPOSABLE) ×6 IMPLANT
KIT ABG SYR 3ML LUER SLIP (SYRINGE) IMPLANT
NEEDLE HYPO 25X5/8 SAFETYGLIDE (NEEDLE) IMPLANT
NS IRRIG 1000ML POUR BTL (IV SOLUTION) ×2 IMPLANT
PACK C SECTION WH (CUSTOM PROCEDURE TRAY) ×2 IMPLANT
PAD ABD 7.5X8 STRL (GAUZE/BANDAGES/DRESSINGS) ×2 IMPLANT
RTRCTR C-SECT PINK 25CM LRG (MISCELLANEOUS) ×2 IMPLANT
SLEEVE SCD COMPRESS KNEE MED (MISCELLANEOUS) ×2 IMPLANT
SPONGE GAUZE 4X4 12PLY (GAUZE/BANDAGES/DRESSINGS) ×2 IMPLANT
STAPLER VISISTAT 35W (STAPLE) IMPLANT
STRIP CLOSURE SKIN 1/2X4 (GAUZE/BANDAGES/DRESSINGS) ×2 IMPLANT
STRIP CLOSURE SKIN 1/4X4 (GAUZE/BANDAGES/DRESSINGS) ×2 IMPLANT
SUT CHROMIC 0 CT 1 (SUTURE) ×2 IMPLANT
SUT MNCRL AB 3-0 PS2 27 (SUTURE) IMPLANT
SUT PLAIN 2 0 (SUTURE) ×2
SUT PLAIN 2 0 XLH (SUTURE) ×2 IMPLANT
SUT PLAIN ABS 2-0 CT1 27XMFL (SUTURE) ×2 IMPLANT
SUT SILK 2 0 SH (SUTURE) IMPLANT
SUT VIC AB 0 CTX 36 (SUTURE) ×4
SUT VIC AB 0 CTX36XBRD ANBCTRL (SUTURE) ×4 IMPLANT
TAPE CLOTH SURG 4X10 WHT LF (GAUZE/BANDAGES/DRESSINGS) ×2 IMPLANT
TOWEL OR 17X24 6PK STRL BLUE (TOWEL DISPOSABLE) ×4 IMPLANT
TRAY FOLEY CATH 14FR (SET/KITS/TRAYS/PACK) ×2 IMPLANT
WATER STERILE IRR 1000ML POUR (IV SOLUTION) ×2 IMPLANT

## 2011-10-26 NOTE — Progress Notes (Signed)
Patient ID: Angela Brock, female   DOB: [birth year], 26 y.o.   MRN: [id number] .Subjective:  Called to BS at 0217 per RN for evaluation of FHT's  Pt comfortable, family remains at bs  Objective: BP 128/60  Pulse 113  Temp 98 F (36.7 C) (Oral)  Resp 18  Ht 5\' 5"  (1.651 m)  Wt 238 lb (107.956 kg)  BMI 39.61 kg/m2  LMP 01/14/2011   FHT:  FHR: 150 bpm, variability: moderate,  accelerations:  Present,  decelerations:  Present several late decels, than recurrent variables, now have resolved periods of decreased variability  UC:   regular, every 2-3 minutes SVE:   Dilation: 9 Effacement (%): 100 Station: -1 Exam by:: Angela Brock, Angela Brock  No cervical change or descent, vtx asynclitic ?OP IUPC and FSE placed, amnioinfusion started  MVU's about 150   Assessment / Plan: Protracted active phase Inadequate labor MHR remains tachycardic, afebrile, pt does feel slightly warm to touch, will give a dose of PO tylenol  Will augment w pitocin Pt has been using bedpan, suspect inadequate emptying - will place foley now  CTO closely   Fetal Wellbeing:  Category II Pain Control:  Epidural  Dr Su Hilt updated  Angela Brock 10/26/2011, 2:57 AM

## 2011-10-26 NOTE — Progress Notes (Signed)
Patient ID: Angela Brock, female   DOB: May 16, 1985, 26 y.o.   MRN: [id number] .Subjective: Feeling some pressure now, otherwise comfortable, has been rotating s-s, exag sims  Objective: BP 131/87  Pulse 134  Temp 97.7 F (36.5 C) (Oral)  Resp 18  Ht 5\' 5"  (1.651 m)  Wt 238 lb (107.956 kg)  BMI 39.61 kg/m2  LMP 01/14/2011   FHT:  FHR: 140 bpm, variability: moderate,  accelerations:  Present,  decelerations:  Present occ early variables, variable w VE, +scalp stim UC:   regular, every 2-3 minutes SVE:   Dilation: Lip/rim Effacement (%): 100 Station: 0 Exam by:: s. Wentworth Edelen, cnm  vtx has descended, ?OT, increased caput,   MVU's 200 since about 5am  Pitocin on 3mu  Assessment / Plan: Protracted active phase Some progress,  Will continue to labor down, will sit HF now Discussed w pt minimal progress and would offer c/s now, pt would like to wait and see if there's progress in the next hour   Maternal tachycardia persists, afebrile, BP stable,   Fetal Wellbeing:  Category I, isolated areas of  tracing Cat 2  Pain Control:  Epidural  rv'd w Dr Su Hilt,   Sanda Klein M 10/26/2011, 6:49 AM

## 2011-10-26 NOTE — Progress Notes (Signed)
Patient ID: Angela Brock, female   DOB: May 07, 1985, 26 y.o.   MRN: [id number] .Subjective:  Resting, no c/o   Objective: BP 111/69  Pulse 130  Temp 98.4 F (36.9 C) (Oral)  Resp 18  Ht 5\' 5"  (1.651 m)  Wt 238 lb (107.956 kg)  BMI 39.61 kg/m2  LMP 01/14/2011   FHT:  FHR: 150 bpm, variability: moderate,  accelerations:  Present,  decelerations:  Present several late decels, and mod variables,  UC:   every 2-4 minutes SVE:   Dilation: 9 Effacement (%): 100 Station: -1 Exam by:: s. Tomas Schamp, cnm  Pitocin at 2mu MVU's 150  Assessment / Plan: Protracted active phase Inadequate laobr  GBS neg Maternal tachycardia  FHR improved w position changes rv'd w pt, CTO, improve labor pattern, position changes to facilitate fetal descent and rotation  Fetal Wellbeing:  Category II Pain Control:  Epidural  Update physician PRN  Malissa Hippo 10/26/2011, 4:30 AM

## 2011-10-26 NOTE — Progress Notes (Signed)
Called by RN to evaluate FHTS rising baseline T100.2 axillary fhts with late decels now resolved UC q 2-3 adequate Vag 7 90 -1 VTX NRFHR O2 10 liter mask, IV bolus, pitocin off, tylenol, ancef now, Dr. Normand Sloop called at [phone] with update as written, cesarean section risks bleeding, infection, injury to bowel and bladder discussed. Lavera Guise, CNM

## 2011-10-26 NOTE — Op Note (Signed)
Cesarean Section Procedure Note   NADA GODLEY  10/25/2011 - 10/26/2011  Indications: Dystocia, Infection and NRFHTS   Pre-operative Diagnosis: failure to progress .   Post-operative Diagnosis: Same   Surgeon: Surgeon(s) and Role:    * Michael Litter, MD - Primary   Assistants: Hassell Halim CNM  Anesthesia: epidural   Procedure Details:  The patient was seen in the Holding Room. The risks, benefits, complications, treatment options, and expected outcomes were discussed with the patient. The patient concurred with the proposed plan, giving informed consent. identified as Angela Brock and the procedure verified as C-Section Delivery. A Time Out was held and the above information confirmed.  After induction of anesthesia, the patient was draped and prepped in the usual sterile manner. A transverse incision was made and carried down through the subcutaneous tissue to the fascia. Fascial incision was made in the midline and extended transversely. The fascia was separated from the underlying rectus muscle superiorly and inferiorly. The peritoneum was identified and entered. Peritoneal incision was extended longitudinally with good visualization of bowel and bladder. An alexisis retractor was placed in the abdomen.   The utero-vesical peritoneal reflection was incised transversely and the bladder flap was bluntly freed from the lower uterine segment. A low transverse uterine incision was made. Delivered from cephalic presentation was a  female infant, with Apgar scores of 3 at one minute and 8 at five minutes. Cord ph was sent.  Arterial 7.14, venous 7.22.   the umbilical cord was clamped and cut cord blood was obtained for evaluation. The placenta was removed Intact and appeared normal. The uterine outline, tubes and ovaries appeared normal}. The uterine incision was closed with running locked sutures of 0Vicryl. A second layer 0 vicrlyl was used to imbricate the uterine incision    Hemostasis  was observed. Lavage was carried out until clear. The alexisis was removed. The peritoneum was closed with o chromic.   The fascia was then reapproximated with running sutures of  vicryl.  The subcutaneous tissue was reaproximated with 2-0 plain.  The subcuticular closure was performed using 3-79monocryl     Instrument, sponge, and needle counts were correct prior the abdominal closure and were correct at the conclusion of the case.    Findings: female infant in vertex presentation.  Nuchal cord times one.  Thick meconium noted.  The pt had normal appearing abdominal and pelvic anatomy.    Estimated Blood Loss:  Total IV Fluids:   Urine Output: 100CC OF clear urine  Specimens: placenta to pathology  Complications: no complications  Disposition: PACU - hemodynamically stable.   Maternal Condition: stable   Baby condition / location:  nursery-stable  Attending Attestation: I was present and scrubbed for the entire procedure.   Signed: Surgeon(s): Michael Litter, MD

## 2011-10-26 NOTE — Addendum Note (Signed)
Addendum  created 10/26/11 1648 by Algis Greenhouse, CRNA   Modules edited:Charges VN, Notes Section

## 2011-10-26 NOTE — Progress Notes (Signed)
Dillard, MD, at bedside. Informs patient of the risks and benefits of a cesarean section. Patient verbalizes understanding. Consents signed.

## 2011-10-26 NOTE — Progress Notes (Signed)
Patient ID: Angela Brock, female   DOB: 1985/08/20, 26 y.o.   MRN: [id number] .Subjective: Resting comfortable w epidural, family remains at bs   Objective: BP 116/68  Pulse 108  Temp 98.2 F (36.8 C) (Oral)  Resp 18  Ht 5\' 5"  (1.651 m)  Wt 238 lb (107.956 kg)  BMI 39.61 kg/m2  LMP 01/14/2011   FHT:  FHR: 130 bpm, variability: moderate,  accelerations:  Present,  decelerations:  Absent UC:   regular, every 2-3 minutes SVE:   Dilation: 9 Effacement (%): 100 Station: -1 Exam by:: Cash Duce, Shelly  AROM for lg amt mod MSAF  Assessment / Plan: Spontaneous labor, progressing normally GBS neg Maternal tachycardia since arrival - afebrile,  WBC 14.6 upon admission - will CTO closely FHR has remained 130-140 since admission    Fetal Wellbeing:  Category I Pain Control:  Epidural  Dr Su Hilt updated   Sanda Klein M 10/26/2011, 1:44 AM

## 2011-10-26 NOTE — Anesthesia Postprocedure Evaluation (Signed)
  Anesthesia Post-op Note  Patient: Angela Brock  Procedure(s) Performed: Procedure(s) (LRB): CESAREAN SECTION (N/A)  Patient Location: PACU  Anesthesia Type: Epidural  Level of Consciousness: awake, alert  and oriented  Airway and Oxygen Therapy: Patient Spontanous Breathing  Post-op Pain: none  Post-op Assessment: Post-op Vital signs reviewed, Patient's Cardiovascular Status Stable, Respiratory Function Stable, Patent Airway, No signs of Nausea or vomiting, Pain level controlled, No headache, No backache, No residual numbness and No residual motor weakness  Post-op Vital Signs: Reviewed and stable  Complications: No apparent anesthesia complications

## 2011-10-26 NOTE — Progress Notes (Signed)
Patient ID: Angela Brock, female   DOB: 12-15-1985, 26 y.o.   MRN: [id number] .Subjective:  Has rcv'd relief w IV meds, now sitting up on birthing ball, family supportive at bs, requests VE, considering epidural (wanted to be be at least 6cm before getting epidural)  Objective: BP 116/68  Pulse 108  Temp 98.2 F (36.8 C) (Oral)  Resp 18  Ht 5\' 5"  (1.651 m)  Wt 238 lb (107.956 kg)  BMI 39.61 kg/m2  LMP 01/14/2011   FHT:  FHR: 130 bpm, variability: moderate,  accelerations:  Present,  decelerations:  Absent - occ tracing MHR secondary to maternal posiiton  UC:   regular, every 2-3 minutes SVE:   7/100/-1 BBOW   Assessment / Plan: Spontaneous labor, progressing normally GBS neg Requests epidural - will notify anesthesia    Fetal Wellbeing:  Category I Pain Control:  Fentanyl  Update physician PRN  Malissa Hippo 10/26/2011, 1:38 AM

## 2011-10-26 NOTE — Anesthesia Postprocedure Evaluation (Signed)
  Anesthesia Post Note  Patient: Angela Brock  Procedure(s) Performed: Procedure(s) (LRB): CESAREAN SECTION (N/A)  Anesthesia type: Epidural  Patient location: Mother/Baby  Post pain: Pain level controlled  Post assessment: Post-op Vital signs reviewed  Last Vitals:  Filed Vitals:   10/26/11 1455  BP: 118/68  Pulse: 110  Temp: 36.3 C  Resp: 18    Post vital signs: Reviewed  Level of consciousness:alert  Complications: No apparent anesthesia complications

## 2011-10-26 NOTE — Transfer of Care (Signed)
Immediate Anesthesia Transfer of Care Note  Patient: Angela Brock  Procedure(s) Performed: Procedure(s) (LRB): CESAREAN SECTION (N/A)  Patient Location: PACU  Anesthesia Type: Epidural  Level of Consciousness: awake, alert  and oriented  Airway & Oxygen Therapy: Patient Spontanous Breathing  Post-op Assessment: Report given to PACU RN and Post -op Vital signs reviewed and stable  Post vital signs: Reviewed and stable  Complications: No apparent anesthesia complications

## 2011-10-26 NOTE — Progress Notes (Addendum)
Chiquita Loth, CNM, at bedside. Informs patient of the risks and benefits of a cesarean section. Patient verbalizes understanding.

## 2011-10-27 ENCOUNTER — Encounter (HOSPITAL_COMMUNITY): Payer: Self-pay | Admitting: Obstetrics and Gynecology

## 2011-10-27 LAB — CBC
HCT: 24.1 % — ABNORMAL LOW (ref 36.0–46.0)
Hemoglobin: 8 g/dL — ABNORMAL LOW (ref 12.0–15.0)
MCV: 80.3 fL (ref 78.0–100.0)
RBC: 3 MIL/uL — ABNORMAL LOW (ref 3.87–5.11)
WBC: 20.3 10*3/uL — ABNORMAL HIGH (ref 4.0–10.5)

## 2011-10-27 NOTE — Progress Notes (Signed)
Subjective: Postpartum Day 1: Cesarean Delivery Patient reports incisional pain, tolerating PO and no problems voiding.  little bleeding, slept some has not had percocet needs more pain relief breastfeeding   Objective:  Results for orders placed during the hospital encounter of 10/25/11 (from the past 24 hour(s))  CBC     Status: Abnormal   Collection Time   10/27/11  5:05 AM      Component Value Range   WBC 20.3 (*) 4.0 - 10.5 K/uL   RBC 3.00 (*) 3.87 - 5.11 MIL/uL   Hemoglobin 8.0 (*) 12.0 - 15.0 g/dL   HCT 11.9 (*) 14.7 - 82.9 %   MCV 80.3  78.0 - 100.0 fL   MCH 26.7  26.0 - 34.0 pg   MCHC 33.2  30.0 - 36.0 g/dL   RDW 56.2  13.0 - 86.5 %   Platelets 287  150 - 400 K/uL     Vital signs in last 24 hours: Temp:  [97.4 F (36.3 C)-99.5 F (37.5 C)] 97.7 F (36.5 C) (08/15 0810) Pulse Rate:  [98-116] 99  (08/15 0810) Resp:  [18-20] 18  (08/15 0810) BP: (100-132)/(58-74) 101/67 mmHg (08/15 0810) SpO2:  [95 %-99 %] 96 % (08/15 0810) Physical Exam:  General: alert, cooperative and no distress Lochia: appropriate Uterine Fundus: firm Incision:  DVT Evaluation: No evidence of DVT seen on physical exam. No edema to lower legs Lungs clear bilaterally AP RRR Bowel sounds hypoactive abd tympanic  S: comfortable,     A normal involution     Lactating     PO day 1     Normal involution\ P elevated WBC will repeat in am, continue care Lavera Guise, CNM

## 2011-10-27 NOTE — Progress Notes (Signed)
UR chart review completed.  

## 2011-10-27 NOTE — Clinical Social Work Maternal (Signed)
Clinical Social Work Department PSYCHOSOCIAL ASSESSMENT - MATERNAL/CHILD 10/27/2011  Patient:  Angela Brock, Angela Brock  Account Number:  [id number]  Admit Date:  10/25/2011  Marjo Bicker Name:   Angela Brock    Clinical Social Worker:  Angela Brock, Angela Brock   Date/Time:  10/27/2011 02:30 PM  Date Referred:  10/27/2011   Referral source  Physician     Referred reason  Substance Abuse   Other referral source:   Per Nursery Report, "MH twice weekly".    I:  FAMILY / HOME ENVIRONMENT Child's legal guardian:  PARENT  Guardian - Name Guardian - Age Guardian - Address  Angela Brock [address] [place], [place] [address]   Other household support members/support persons Name Relationship DOB  Angela Brock/MOB's friend FRIEND    Other support:   MOB's mom  Friends    *FOB and his family have chosen to not be involved during pregnancy- she reports FOB, Angela Brock, now resides in [place] and is aware of delivery.    II  PSYCHOSOCIAL DATA Information Source:  Patient Interview  Insurance claims handler Resources Employment:   MOB- [employer]   Financial resources:  Medicaid If Medicaid - County:  [place] Clinical biochemist  WIC   School / Grade:   Maternity Care Coordinator / Child Services Coordination / Early Interventions:  Cultural issues impacting care:   None identified    III  STRENGTHS Strengths  Adequate Resources  Home prepared for Child (including basic supplies)  Supportive family/friends  Other - See comment   Strength comment:  MOB has gained strength through the pregnancy to pursue a career in [place]- she plans to begin school next Fall. She shares a personal sense of growth and maturity in her ability to carry the baby for 9months without the support of the FOB. She is excited, motivated and encouraged as she becomes a single mom. "I will be a top notch mom since the father isn't involved- I want to be a better mom than my mother was to  me....."   IV  RISK FACTORS AND CURRENT PROBLEMS Current Problem:  YES   Risk Factor & Current Problem Patient Issue Family Issue Risk Factor / Current Problem Comment  Substance Abuse Y Y hx of marijuana use   N N     V  SOCIAL WORK ASSESSMENT Patient admits to using marijuana however she claims it has been 5 -6 months since she last used. She reports that she was anxious and depressed and it helped her to "let go" and relax- She does not plan to use going  forward- CSW helped MOB identify alternative ways to deal with depression and anxiety. She claims a lot of her anxiety and depression was related to becoming  a mother and questioning her ability to do this-  MOB has a positive spirit about being a single mom and feels empowered to provide for her new  baby.  MOB states she has all needed supplies for baby at home- Dr. Zenaida Brock will be Pediatrician.      VI SOCIAL WORK PLAN Social Work Plan  Information/Referral to [facility]   Type of pt/family education:   Encouraged MOB to seek help through counseling and/or RX for anxiety and depression if she sees either return or worsening. She has a fear of being on lots of meds for mental health- educated her on this and again stressed the importance of chosing this route over returning to marijuana use.  MOB  aware of plans to screen meconium and our responsibility if this comes back positive. She again states she last used  5-6 months ago.   If child protective services report - county:   If child protective services report - date:   Information/referral to community resources comment:   DSS  Mental Health   Other social work plan:   No further CSW needs at this time- will monitor Meconim screen.     Angela Brock, MSW, Angela Brock [phone]

## 2011-10-28 ENCOUNTER — Encounter: Payer: Medicaid Other | Admitting: Obstetrics and Gynecology

## 2011-10-28 ENCOUNTER — Other Ambulatory Visit: Payer: Medicaid Other

## 2011-10-28 LAB — CBC WITH DIFFERENTIAL/PLATELET
Basophils Absolute: 0 10*3/uL (ref 0.0–0.1)
Basophils Relative: 0 % (ref 0–1)
Eosinophils Relative: 1 % (ref 0–5)
HCT: 24.7 % — ABNORMAL LOW (ref 36.0–46.0)
Lymphocytes Relative: 19 % (ref 12–46)
MCHC: 32.4 g/dL (ref 30.0–36.0)
MCV: 80.7 fL (ref 78.0–100.0)
Monocytes Absolute: 1.7 10*3/uL — ABNORMAL HIGH (ref 0.1–1.0)
Neutro Abs: 13.2 10*3/uL — ABNORMAL HIGH (ref 1.7–7.7)
Platelets: 324 10*3/uL (ref 150–400)
RDW: 15.6 % — ABNORMAL HIGH (ref 11.5–15.5)
WBC: 18.7 10*3/uL — ABNORMAL HIGH (ref 4.0–10.5)

## 2011-10-28 NOTE — Progress Notes (Signed)
Subjective:  Postpartum Day 2: Cesarean Delivery  Patient reports incisional pain, tolerating PO and no problems voiding. little bleeding, slept some  Comfortable with motrin and  percocet  Objective: Temp:  [97.7 F (36.5 C)-98.1 F (36.7 C)] 98.1 F (36.7 C) (08/16 1329) Pulse Rate:  [97-106] 101  (08/16 1329) Resp:  [17-18] 17  (08/16 1329) BP: (106-131)/(72-81) 117/74 mmHg (08/16 1329) Results for orders placed during the hospital encounter of 10/25/11 (from the past 48 hour(s))  CBC     Status: Abnormal   Collection Time   10/27/11  5:05 AM      Component Value Range Comment   WBC 20.3 (*) 4.0 - 10.5 K/uL    RBC 3.00 (*) 3.87 - 5.11 MIL/uL    Hemoglobin 8.0 (*) 12.0 - 15.0 g/dL    HCT 16.1 (*) 09.6 - 46.0 %    MCV 80.3  78.0 - 100.0 fL    MCH 26.7  26.0 - 34.0 pg    MCHC 33.2  30.0 - 36.0 g/dL    RDW 04.5  40.9 - 81.1 %    Platelets 287  150 - 400 K/uL   CBC WITH DIFFERENTIAL     Status: Abnormal   Collection Time   10/28/11  5:00 AM      Component Value Range Comment   WBC 18.7 (*) 4.0 - 10.5 K/uL    RBC 3.06 (*) 3.87 - 5.11 MIL/uL    Hemoglobin 8.0 (*) 12.0 - 15.0 g/dL    HCT 91.4 (*) 78.2 - 46.0 %    MCV 80.7  78.0 - 100.0 fL    MCH 26.1  26.0 - 34.0 pg    MCHC 32.4  30.0 - 36.0 g/dL    RDW 95.6 (*) 21.3 - 15.5 %    Platelets 324  150 - 400 K/uL    Neutrophils Relative 71  43 - 77 %    Neutro Abs 13.2 (*) 1.7 - 7.7 K/uL    Lymphocytes Relative 19  12 - 46 %    Lymphs Abs 3.5  0.7 - 4.0 K/uL    Monocytes Relative 9  3 - 12 %    Monocytes Absolute 1.7 (*) 0.1 - 1.0 K/uL    Eosinophils Relative 1  0 - 5 %    Eosinophils Absolute 0.2  0.0 - 0.7 K/uL    Basophils Relative 0  0 - 1 %    Basophils Absolute 0.0  0.0 - 0.1 K/uL                                                                Vital signs in last 24 hours:  Temp: [97.4 F (36.3 C)-99.5 F (37.5 C)] 97.7 F (36.5 C) (08/15 0810)  Pulse Rate: [98-116] 99 (08/15 0810)  Resp: [18-20] 18  (08/15 0810)  BP: (100-132)/(58-74) 101/67 mmHg (08/15 0810)  SpO2: [95 %-99 %] 96 % (08/15 0810)  Physical Exam:  General: alert, cooperative and no distress  Lochia: appropriate  Uterine Fundus: firm  Incision: with steri stips well approximated no redness, edema, or drainage DVT Evaluation: No evidence of DVT seen on physical exam. No edema to lower legs  Lungs clear bilaterally  AP RRR  Bowel sounds hypoactive abd tympanic  S:  comfortable,  A normal involution  Lactating  PO day 2  Normal involution\ P no plan for birth control continue care  Lavera Guise, CNM

## 2011-10-29 DIAGNOSIS — Z98891 History of uterine scar from previous surgery: Secondary | ICD-10-CM | POA: Diagnosis not present

## 2011-10-29 MED ORDER — FERROUS SULFATE 325 (65 FE) MG PO TABS: 325.0000 mg | ORAL_TABLET | Freq: Every day | ORAL | Status: DC

## 2011-10-29 MED ORDER — SENNOSIDES-DOCUSATE SODIUM 8.6-50 MG PO TABS: 2.0000 | ORAL_TABLET | Freq: Every day | ORAL | Status: AC

## 2011-10-29 MED ORDER — OXYCODONE-ACETAMINOPHEN 5-325 MG PO TABS: 1.0000 | ORAL_TABLET | ORAL | Status: AC | PRN

## 2011-10-29 MED ORDER — IBUPROFEN 600 MG PO TABS: 600.0000 mg | ORAL_TABLET | Freq: Four times a day (QID) | ORAL | Status: AC | PRN

## 2011-10-29 MED ORDER — BISACODYL 10 MG RE SUPP: 10.0000 mg | Freq: Every day | RECTAL | Status: AC | PRN

## 2011-10-29 NOTE — Discharge Summary (Signed)
Physician Discharge Summary  Patient ID: Angela Brock MRN: [id number] DOB/AGE: 1986-01-31 26 y.o.  Admit date: 10/25/2011 Discharge date: 10/29/2011  Admission Diagnoses: [redacted]w[redacted]d in labor   Discharge Diagnoses:  Principal Problem:  *Status post primary low transverse cesarean section Active Problems:  Meconium staining anemia  Discharged Condition: stable  Hospital Course:  [redacted]w[redacted]d in labor, LTCS FTP and maternal fever, Normal involution, anemia, lactating, declines birth control options discussed.   Consults: None  Significant Diagnostic Studies: labs:  Hemoglobin & Hematocrit     Component Value Date/Time   HGB 8.0* 10/28/2011 0500   HCT 24.7* 10/28/2011 0500     Treatments: IV hydration  Discharge Exam: Blood pressure 112/77, pulse 101, temperature 97.9 F (36.6 C), temperature source Oral, resp. rate 18, height 5\' 5"  (1.651 m), weight 238 lb (107.956 kg), last menstrual period 01/14/2011, SpO2 99.00%, unknown if currently breastfeeding. General appearance: alert, cooperative and no distress Subjective: Postpartum Day 3 Cesarean Delivery Patient reports tolerating PO, + flatus, + BM and no problems voiding comfortable with motrin and percocet.    Objective:   Vital signs in last 24 hours: Temp:  [97.9 F (36.6 C)-98.2 F (36.8 C)] 97.9 F (36.6 C) (08/17 0516) Pulse Rate:  [101-105] 101  (08/17 0516) Resp:  [17-18] 18  (08/17 0516) BP: (112-129)/(74-77) 112/77 mmHg (08/17 0516) SpO2:  [99 %] 99 % (08/16 2115)  Physical Exam:  General: alert, cooperative and no distress Lochia: appropriate Uterine Fundus: firm Incision: well approximated no redness, edema, or drainage DVT Evaluation: No evidence of DVT seen on physical exam. Calf/Ankle edema is present trace Lungs clear bilaterally AP RRR Bowel sounds active abd nontympanic S: comfortable, little bleeding, slept well     Disposition: 01-Home or Self Care with baby  Discharge Orders    Future Orders  Please Complete By Expires   OB RESULTS CONSOLE RPR      Comments:   This external order was created through the Results Console.   OB RESULTS CONSOLE HIV antibody      Comments:   This external order was created through the Results Console.   OB RESULTS CONSOLE Rubella Antibody      Comments:   This external order was created through the Results Console.   OB RESULTS CONSOLE Hepatitis B surface antigen      Comments:   This external order was created through the Results Console.   OB RESULTS CONSOLE Antibody Screen      Comments:   This external order was created through the Results Console.     Medication List  As of 10/29/2011  7:50 AM   STOP taking these medications         famotidine 40 MG tablet         TAKE these medications         bisacodyl 10 MG suppository   Commonly known as: DULCOLAX   Place 1 suppository (10 mg total) rectally daily as needed.      ferrous sulfate 325 (65 FE) MG tablet   Take 1 tablet (325 mg total) by mouth daily.      ibuprofen 600 MG tablet   Commonly known as: ADVIL,MOTRIN   Take 1 tablet (600 mg total) by mouth every 6 (six) hours as needed.      oxyCODONE-acetaminophen 5-325 MG per tablet   Commonly known as: PERCOCET/ROXICET   Take 1-2 tablets by mouth every 4 (four) hours as needed (moderate - severe pain).  prenatal multivitamin Tabs   Take 1 tablet by mouth daily.      senna-docusate 8.6-50 MG per tablet   Commonly known as: Senokot-S   Take 2 tablets by mouth at bedtime.           Follow-up Information    Follow up in 6 weeks.        CC[facility] handbook SignedLavera Guise 10/29/2011, 7:50 AM

## 2011-11-23 ENCOUNTER — Ambulatory Visit (INDEPENDENT_AMBULATORY_CARE_PROVIDER_SITE_OTHER): Payer: Medicaid Other | Admitting: Obstetrics and Gynecology

## 2011-11-23 ENCOUNTER — Encounter: Payer: Self-pay | Admitting: Obstetrics and Gynecology

## 2011-11-23 NOTE — Progress Notes (Signed)
  Date of delivery: 08/14/2013Female Name: Angela Brock Vaginal delivery:no Cesarean section:yes Tubal ligation:no GDM:no Breast Feeding:yes Bottle Feeding:yes Post-Partum Blues:no Abnormal pap:yes Normal GU function: yes Normal GI function:yes Returning to work:yes/ Ready to go now.  EPDS=1  Filed Vitals:   11/23/11 1524  BP: 120/70  Temp: 98.6 F (37 C)  Resp: 16   ROS: noncontributory  Pelvic exam:  VULVA: normal appearing vulva with no masses, tenderness or lesions,  VAGINA: normal appearing vagina with normal color and discharge, no lesions, CERVIX: normal appearing cervix without discharge or lesions,  UTERUS: uterus is normal size, shape, consistency and nontender,  ADNEXA: normal adnexa in size, nontender and no masses.  A/P AEX in March 2014 Declines BC plans abstinence Note to return to work Rec still no heavy lifting or IC until after 6wks

## 2011-12-09 ENCOUNTER — Ambulatory Visit: Payer: Medicaid Other | Admitting: Obstetrics and Gynecology

## 2013-05-03 ENCOUNTER — Ambulatory Visit: Payer: Self-pay | Admitting: Advanced Practice Midwife

## 2013-05-04 ENCOUNTER — Encounter (HOSPITAL_COMMUNITY): Payer: Self-pay

## 2013-05-04 ENCOUNTER — Inpatient Hospital Stay (HOSPITAL_COMMUNITY)
Admission: AD | Admit: 2013-05-04 | Discharge: 2013-05-04 | Disposition: A | Discharge status: Home / Self Care | Payer: Medicaid Other | Source: Ambulatory Visit | Attending: Obstetrics & Gynecology | Admitting: Obstetrics & Gynecology

## 2013-05-04 DIAGNOSIS — B9689 Other specified bacterial agents as the cause of diseases classified elsewhere: Secondary | ICD-10-CM | POA: Insufficient documentation

## 2013-05-04 DIAGNOSIS — N760 Acute vaginitis: Secondary | ICD-10-CM | POA: Insufficient documentation

## 2013-05-04 DIAGNOSIS — R109 Unspecified abdominal pain: Secondary | ICD-10-CM | POA: Insufficient documentation

## 2013-05-04 DIAGNOSIS — A499 Bacterial infection, unspecified: Secondary | ICD-10-CM | POA: Insufficient documentation

## 2013-05-04 DIAGNOSIS — Z87891 Personal history of nicotine dependence: Secondary | ICD-10-CM | POA: Insufficient documentation

## 2013-05-04 LAB — URINALYSIS, ROUTINE W REFLEX MICROSCOPIC
Bilirubin Urine: NEGATIVE
GLUCOSE, UA: NEGATIVE mg/dL
HGB URINE DIPSTICK: NEGATIVE
Ketones, ur: NEGATIVE mg/dL
Leukocytes, UA: NEGATIVE
Nitrite: NEGATIVE
PH: 6.5 (ref 5.0–8.0)
PROTEIN: NEGATIVE mg/dL
SPECIFIC GRAVITY, URINE: 1.025 (ref 1.005–1.030)
Urobilinogen, UA: 0.2 mg/dL (ref 0.0–1.0)

## 2013-05-04 LAB — WET PREP, GENITAL
TRICH WET PREP: NONE SEEN
YEAST WET PREP: NONE SEEN

## 2013-05-04 LAB — POCT PREGNANCY, URINE: PREG TEST UR: NEGATIVE

## 2013-05-04 MED ORDER — METRONIDAZOLE 500 MG PO TABS: 500.0000 mg | ORAL_TABLET | Freq: Three times a day (TID) | ORAL | Status: DC

## 2013-05-04 NOTE — MAU Note (Signed)
Pt presents complaining of vaginal irritation with white discharge and upper abdominal pain for a month.

## 2013-05-04 NOTE — MAU Provider Note (Signed)
Attestation of Attending Supervision of Advanced Practitioner (CNM/NP): Evaluation and management procedures were performed by the Advanced Practitioner under my supervision and collaboration.  I have reviewed the Advanced Practitioner's note and chart, and I agree with the management and plan.  HARRAWAY-SMITH, Kamden Reber 7:56 PM

## 2013-05-04 NOTE — Discharge Instructions (Signed)
Safe Sex Safe sex is about reducing the risk of giving or getting a sexually transmitted disease (STD). STDs are spread through sexual contact involving the genitals, mouth, or rectum. Some STDS can be cured and others cannot. Safe sex can also prevent unintended pregnancies.  SAFE SEX PRACTICES  Limit your sexual activity to only one partner who is only having sex with you.  Talk to your partner about their past partners, past STDs, and drug use.  Use a condom every time you have sexual intercourse. This includes vaginal, oral, and anal sexual activity. Both females and males should wear condoms during oral sex. Only use latex or polyurethane condoms and water-based lubricants. Petroleum-based lubricants or oils used to lubricate a condom will weaken the condom and increase the chance that it will break. The condom should be in place from the beginning to the end of sexual activity. Wearing a condom reduces, but does not completely eliminate, your risk of getting or giving a STD. STDs can be spread by contact with skin of surrounding areas.  Get vaccinated for hepatitis B and HPV. Angela Brock  Avoid alcohol and recreational drugs which can affect your judgement. You may forget to use a condom or participate in high-risk sex.  For females, avoid douching after sexual intercourse. Douching can spread an infection farther into the reproductive tract.  Check your body for signs of sores, blisters, rashes, or unusual discharge. See your caregiver if you notice any of these signs.  Avoid sexual contact if you have symptoms of an infection or are being treated for an STD. If you or your partner has herpes, avoid sexual contact when blisters are present. Use condoms at all other times.  See your caregiver for regular screenings, examinations, and tests for STDs. Before having sex with a new partner, each of you should be screened for STDs and talk about the results with your partner. BENEFITS OF SAFE SEX    There is less of a chance of getting or giving an STD.  You can prevent unwanted or unintended pregnancies.  By discussing safer sex concerns with your partner, you may increase feelings of intimacy, comfort, trust, and honesty between the both of you. Document Released: 04/07/2004 Document Revised: 11/23/2011 Document Reviewed: 08/22/2011 [facility] Patient Information 2014 [place], [place]. Contraception Choices Contraception (birth control) is the use of any methods or devices to prevent pregnancy. Below are some methods to help avoid pregnancy. HORMONAL METHODS   Contraceptive implant This is a thin, plastic tube containing progesterone hormone. It does not contain estrogen hormone. Your health care provider inserts the tube in the inner part of the upper arm. The tube can remain in place for up to 3 years. After 3 years, the implant must be removed. The implant prevents the ovaries from releasing an egg (ovulation), thickens the cervical mucus to prevent sperm from entering the uterus, and thins the lining of the inside of the uterus.  Progesterone-only injections These injections are given every 3 months by your health care provider to prevent pregnancy. This synthetic progesterone hormone stops the ovaries from releasing eggs. It also thickens cervical mucus and changes the uterine lining. This makes it harder for sperm to survive in the uterus.  Birth control pills These pills contain estrogen and progesterone hormone. They work by preventing the ovaries from releasing eggs (ovulation). They also cause the cervical mucus to thicken, preventing the sperm from entering the uterus. Birth control pills are prescribed by a health care provider.Birth control pills can  also be used to treat heavy periods.  Minipill This type of birth control pill contains only the progesterone hormone. They are taken every day of each month and must be prescribed by your health care provider.  Birth control  patch The patch contains hormones similar to those in birth control pills. It must be changed once a week and is prescribed by a health care provider.  Vaginal ring The ring contains hormones similar to those in birth control pills. It is left in the vagina for 3 weeks, removed for 1 week, and then a new one is put back in place. The patient must be comfortable inserting and removing the ring from the vagina.A health care provider's prescription is necessary.  Emergency contraception Emergency contraceptives prevent pregnancy after unprotected sexual intercourse. This pill can be taken right after sex or up to 5 days after unprotected sex. It is most effective the sooner you take the pills after having sexual intercourse. Most emergency contraceptive pills are available without a prescription. Check with your pharmacist. Do not use emergency contraception as your only form of birth control. BARRIER METHODS   Female condom This is a thin sheath (latex or rubber) that is worn over the penis during sexual intercourse. It can be used with spermicide to increase effectiveness.  Female condom. This is a soft, loose-fitting sheath that is put into the vagina before sexual intercourse.  Diaphragm This is a soft, latex, dome-shaped barrier that must be fitted by a health care provider. It is inserted into the vagina, along with a spermicidal jelly. It is inserted before intercourse. The diaphragm should be left in the vagina for 6 to 8 hours after intercourse.  Cervical cap This is a round, soft, latex or plastic cup that fits over the cervix and must be fitted by a health care provider. The cap can be left in place for up to 48 hours after intercourse.  Sponge This is a soft, circular piece of polyurethane foam. The sponge has spermicide in it. It is inserted into the vagina after wetting it and before sexual intercourse.  Spermicides These are chemicals that kill or block sperm from entering the cervix and  uterus. They come in the form of creams, jellies, suppositories, foam, or tablets. They do not require a prescription. They are inserted into the vagina with an applicator before having sexual intercourse. The process must be repeated every time you have sexual intercourse. INTRAUTERINE CONTRACEPTION  Intrauterine device (IUD) This is a T-shaped device that is put in a woman's uterus during a menstrual period to prevent pregnancy. There are 2 types:  Copper IUD This type of IUD is wrapped in copper wire and is placed inside the uterus. Copper makes the uterus and fallopian tubes produce a fluid that kills sperm. It can stay in place for 10 years.  Hormone IUD This type of IUD contains the hormone progestin (synthetic progesterone). The hormone thickens the cervical mucus and prevents sperm from entering the uterus, and it also thins the uterine lining to prevent implantation of a fertilized egg. The hormone can weaken or kill the sperm that get into the uterus. It can stay in place for 3 5 years, depending on which type of IUD is used. PERMANENT METHODS OF CONTRACEPTION  Female tubal ligation This is when the woman's fallopian tubes are surgically sealed, tied, or blocked to prevent the egg from traveling to the uterus.  Hysteroscopic sterilization This involves placing a small coil or insert into each fallopian  tube. Your doctor uses a technique called hysteroscopy to do the procedure. The device causes scar tissue to form. This results in permanent blockage of the fallopian tubes, so the sperm cannot fertilize the egg. It takes about 3 months after the procedure for the tubes to become blocked. You must use another form of birth control for these 3 months.  Female sterilization This is when the female has the tubes that carry sperm tied off (vasectomy).This blocks sperm from entering the vagina during sexual intercourse. After the procedure, the man can still ejaculate fluid (semen). NATURAL PLANNING  METHODS  Natural family planning This is not having sexual intercourse or using a barrier method (condom, diaphragm, cervical cap) on days the woman could become pregnant.  Calendar method This is keeping track of the length of each menstrual cycle and identifying when you are fertile.  Ovulation method This is avoiding sexual intercourse during ovulation.  Symptothermal method This is avoiding sexual intercourse during ovulation, using a thermometer and ovulation symptoms.  Post ovulation method This is timing sexual intercourse after you have ovulated. Regardless of which type or method of contraception you choose, it is important that you use condoms to protect against the transmission of sexually transmitted infections (STIs). Talk with your health care provider about which form of contraception is most appropriate for you. Document Released: 02/28/2005 Document Revised: 10/31/2012 Document Reviewed: 08/23/2012 [facility] Patient Information 2014 [place]. Prenatal Care [facility]    [facility]  [phone]     Phone: [phone]          [facility]                      [facility]  @[place]     Phone: [phone]  Phone: [phone]         [facility]     Phone: [phone]  Phone: [phone]           [facility] [place]                hone: [phone]  Phone: [phone]         [facility] Franco[facility] Ce[facility]      Phone: [phone]  Phone: [phone]         [facility]                Phone: [phone]  [facility]   [address]Phone:[phone]    [facility]          Phone: [phone] [facility]   Phone: [phone]

## 2013-05-04 NOTE — MAU Provider Note (Signed)
CC: Vaginal Discharge and Abdominal Pain    First Provider Initiated Contact with Patient 05/04/13 1710      HPI Angela Brock is a 28 y.o. G1P1001who presents with onset several days ago of malodorous vaginal discharge. She denies vaginal itching or irritation denies external irritation. No dysuria, hematuria, frequency or urgency of urination. LMP 04/27/2013. No contraception. No dysparunia.  Denies abnormal bleeding. She describes diffuse dull upper abdominal pain that has been intermittent since she had her baby a year ago and sometimes radiates to low back. She denies having any pain now.  Past Medical History  Diagnosis Date  . Normal pregnancy, incidental 07/18/2011  . Headache(784.0)   . Hx of chlamydia infection     28 years old  . H/O menorrhagia   . Hx of dysmenorrhea   . BV (bacterial vaginosis)   . Abnormal Pap smear     age 70     OB History  Gravida Para Term Preterm AB SAB TAB Ectopic Multiple Living  1 1 1  0 0 0 0 0 0 1    # Outcome Date GA Lbr Len/2nd Weight Sex Delivery Anes PTL Lv  1 TRM 10/26/11 [redacted]w[redacted]d  3.575 kg (7 lb 14.1 oz) F LTCS EPI  Y      Past Surgical History  Procedure Laterality Date  . No past surgeries    . Cesarean section  10/26/2011    Procedure: CESAREAN SECTION;  Surgeon: Michael Litter, MD;  Location: [facility];  Service: Gynecology;  Laterality: N/A;    History   Social History  . Marital Status: Single    Spouse Name: N/A    Number of Children: N/A  . Years of Education: N/A   Occupational History  . Not on file.   Social History Main Topics  . Smoking status: Former Smoker -- 1.00 packs/day    Types: Cigarettes  . Smokeless tobacco: Never Used  . Alcohol Use: 1.2 oz/week    2 Shots of liquor per week     Comment: not currently  . Drug Use: 2.00 per week    Special: Marijuana  . Sexual Activity: Not Currently    Birth Control/ Protection: None     Comment: currently pregnant   Other Topics Concern  . Not on file    Social History Narrative  . No narrative on file    No current facility-administered medications on file prior to encounter.   Current Outpatient Prescriptions on File Prior to Encounter  Medication Sig Dispense Refill  . Prenatal Vit-Fe Fumarate-FA (PRENATAL MULTIVITAMIN) TABS Take 1 tablet by mouth daily.        No Known Allergies  ROS Pertinent items in HPI  PHYSICAL EXAM Filed Vitals:   05/04/13 1706  BP: 138/82  Pulse: 86  Temp: 98.2 F (36.8 C)  Resp: 18   General: Well nourished, well developed female in no acute distress Cardiovascular: Normal rate Respiratory: Normal effort Abdomen: Soft, nontender throughout Back: No CVAT Extremities: No edema Neurologic: Alert and oriented Speculum exam: NEFG; vagina with moderate thin mucusy discharge, no blood; cervix clean Bimanual exam: cervix closed, no CMT; uterus NSSP; no adnexal tenderness or masses   LAB RESULTS Results for orders placed during the hospital encounter of 05/04/13 (from the past 24 hour(s))  URINALYSIS, ROUTINE W REFLEX MICROSCOPIC     Status: None   Collection Time    05/04/13  4:55 PM      Result Value Ref Range   Color,  Urine YELLOW  YELLOW   APPearance CLEAR  CLEAR   Specific Gravity, Urine 1.025  1.005 - 1.030   pH 6.5  5.0 - 8.0   Glucose, UA NEGATIVE  NEGATIVE mg/dL   Hgb urine dipstick NEGATIVE  NEGATIVE   Bilirubin Urine NEGATIVE  NEGATIVE   Ketones, ur NEGATIVE  NEGATIVE mg/dL   Protein, ur NEGATIVE  NEGATIVE mg/dL   Urobilinogen, UA 0.2  0.0 - 1.0 mg/dL   Nitrite NEGATIVE  NEGATIVE   Leukocytes, UA NEGATIVE  NEGATIVE  POCT PREGNANCY, URINE     Status: None   Collection Time    05/04/13  5:12 PM      Result Value Ref Range   Preg Test, Ur NEGATIVE  NEGATIVE  WET PREP, GENITAL     Status: Abnormal   Collection Time    05/04/13  5:20 PM      Result Value Ref Range   Yeast Wet Prep HPF POC NONE SEEN  NONE SEEN   Trich, Wet Prep NONE SEEN  NONE SEEN   Clue Cells Wet Prep HPF  POC MODERATE (*) NONE SEEN   WBC, Wet Prep HPF POC MODERATE (*) NONE SEEN    IMAGING No results found.  MAU COURSE GC/CT sent  ASSESSMENT  1. BV (bacterial vaginosis)    Needs contraception  PLAN Discharge home. See AVS for patient education.    Medication List         ibuprofen 200 MG tablet  Commonly known as:  ADVIL,MOTRIN  Take 400 mg by mouth every 6 (six) hours as needed for moderate pain.     metroNIDAZOLE 500 MG tablet  Commonly known as:  FLAGYL  Take 1 tablet (500 mg total) by mouth 3 (three) times daily.       Follow-up Information   Call [facility].   Contact information:   [address] [place] [address]       Schedule an appointment as soon as possible for a visit with [facility].   Contact information:   [address] [place] [id number]      .  Danae Orleanseirdre C Dejae Bernet, CNM 05/04/2013 5:22 PM

## 2013-05-06 LAB — GC/CHLAMYDIA PROBE AMP
CT Probe RNA: NEGATIVE
GC Probe RNA: NEGATIVE

## 2013-05-27 ENCOUNTER — Ambulatory Visit: Payer: Medicaid Other | Admitting: Family Medicine

## 2013-08-26 ENCOUNTER — Encounter (HOSPITAL_COMMUNITY): Payer: Self-pay | Admitting: *Deleted

## 2013-08-26 ENCOUNTER — Inpatient Hospital Stay (HOSPITAL_COMMUNITY)
Admission: AD | Admit: 2013-08-26 | Discharge: 2013-08-26 | Disposition: A | Discharge status: Home / Self Care | Payer: Medicaid Other | Source: Ambulatory Visit | Attending: Obstetrics & Gynecology | Admitting: Obstetrics & Gynecology

## 2013-08-26 DIAGNOSIS — Z87891 Personal history of nicotine dependence: Secondary | ICD-10-CM | POA: Insufficient documentation

## 2013-08-26 DIAGNOSIS — B372 Candidiasis of skin and nail: Secondary | ICD-10-CM

## 2013-08-26 DIAGNOSIS — B373 Candidiasis of vulva and vagina: Secondary | ICD-10-CM | POA: Insufficient documentation

## 2013-08-26 DIAGNOSIS — B3731 Acute candidiasis of vulva and vagina: Secondary | ICD-10-CM | POA: Insufficient documentation

## 2013-08-26 LAB — URINE MICROSCOPIC-ADD ON

## 2013-08-26 LAB — WET PREP, GENITAL
CLUE CELLS WET PREP: NONE SEEN
Trich, Wet Prep: NONE SEEN
Yeast Wet Prep HPF POC: NONE SEEN

## 2013-08-26 LAB — URINALYSIS, ROUTINE W REFLEX MICROSCOPIC
BILIRUBIN URINE: NEGATIVE
GLUCOSE, UA: NEGATIVE mg/dL
HGB URINE DIPSTICK: NEGATIVE
KETONES UR: NEGATIVE mg/dL
Nitrite: NEGATIVE
PROTEIN: NEGATIVE mg/dL
Specific Gravity, Urine: >1.03 — ABNORMAL HIGH (ref 1.005–1.030)
Urobilinogen, UA: 0.2 mg/dL (ref 0.0–1.0)
pH: 6 (ref 5.0–8.0)

## 2013-08-26 LAB — POCT PREGNANCY, URINE: PREG TEST UR: NEGATIVE

## 2013-08-26 MED ORDER — NYSTATIN 100000 UNIT/GM EX CREA: TOPICAL_CREAM | CUTANEOUS | Status: DC

## 2013-08-26 MED ORDER — TRIAMCINOLONE ACETONIDE 0.1 % EX CREA: 1.0000 "application " | TOPICAL_CREAM | Freq: Two times a day (BID) | CUTANEOUS | Status: DC

## 2013-08-26 NOTE — MAU Note (Signed)
Not in lobby, took daughter downstairs to get something to eat. (per adm clerk)

## 2013-08-26 NOTE — Discharge Instructions (Signed)
Cutaneous Candidiasis Cutaneous candidiasis is a condition in which there is an overgrowth of yeast (candida) on the skin. Yeast normally live on the skin, but in small enough numbers not to cause any symptoms. In certain cases, increased growth of the yeast may cause an actual yeast infection. This kind of infection usually occurs in areas of the skin that are constantly warm and moist, such as the armpits or the groin. Yeast is the most common cause of diaper rash in babies and in people who cannot control their bowel movements (incontinence). CAUSES  The fungus that most often causes cutaneous candidiasis is Candida albicans. Conditions that can increase the risk of getting a yeast infection of the skin include:  Obesity.  Pregnancy.  Diabetes.  Taking antibiotic medicine.  Taking birth control pills.  Taking steroid medicines.  Thyroid disease.  An iron or zinc deficiency.  Problems with the immune system. SYMPTOMS   Red, swollen area of the skin.  Bumps on the skin.  Itchiness. DIAGNOSIS  The diagnosis of cutaneous candidiasis is usually based on its appearance. Light scrapings of the skin may also be taken and viewed under a microscope to identify the presence of yeast. TREATMENT  Antifungal creams may be applied to the infected skin. In severe cases, oral medicines may be needed.  HOME CARE INSTRUCTIONS   Keep your skin clean and dry.  Maintain a healthy weight.  If you have diabetes, keep your blood sugar under control. SEEK IMMEDIATE MEDICAL CARE IF:  Your rash continues to spread despite treatment.  You have a fever, chills, or abdominal pain. Document Released: 11/16/2010 Document Revised: 05/23/2011 Document Reviewed: 11/16/2010 [employer] Patient Information 2014 [employer].  

## 2013-08-26 NOTE — MAU Note (Signed)
Patient presents to MAU with c/o white thick discharge, vaginal itching and redness x 1 month.

## 2013-08-26 NOTE — MAU Provider Note (Signed)
Attestation of Attending Supervision of Advanced Practitioner (CNM/NP): Evaluation and management procedures were performed by the Advanced Practitioner under my supervision and collaboration.  I have reviewed the Advanced Practitioner's note and chart, and I agree with the management and plan.  HARRAWAY-SMITH, Mingo Siegert 3:10 PM

## 2013-08-26 NOTE — MAU Provider Note (Signed)
History     CSN: [id number]  Arrival date and time: 08/26/13 1217   First Provider Initiated Contact with Patient 08/26/13 1244      Chief Complaint  Patient presents with  . Vaginal Discharge   HPI [name] is a 28 y.o. G1P1001 who presents to MAU today with complaint of vaginal irritation and itching. She states she was given PO medication for yeast in March and didn't feel like that helped. She then used monistat and felt that symptoms have been worse. She states thick, white-yellow discharge off and on x 2 months. She is sexually active with one partner and does not use condoms or birth control. She states that she has noted a rash and itching and irritation to the skin. She denies fever or abdominal pain.   OB History   Grav Para Term Preterm Abortions TAB SAB Ect Mult Living   1 1 1  0 0 0 0 0 0 1      Past Medical History  Diagnosis Date  . Normal pregnancy, incidental 07/18/2011  . Headache(784.0)   . Hx of chlamydia infection     28 years old  . H/O menorrhagia   . Hx of dysmenorrhea   . BV (bacterial vaginosis)   . Abnormal Pap smear     age 89     Past Surgical History  Procedure Laterality Date  . No past surgeries    . Cesarean section  10/26/2011    Procedure: CESAREAN SECTION;  Surgeon: Michael LitterNaima A Dillard, MD;  Location: [facility];  Service: Gynecology;  Laterality: N/A;    Family History  Problem Relation Age of Onset  . Hypertension Mother   . Depression Mother   . Migraines Mother   . Anesthesia problems Neg Hx   . Alcohol abuse Maternal Grandmother   . Alcohol abuse Maternal Grandfather   . Alcohol abuse Paternal Grandfather     History  Substance Use Topics  . Smoking status: Former Smoker -- 1.00 packs/day    Types: Cigarettes  . Smokeless tobacco: Never Used  . Alcohol Use: 1.2 oz/week    2 Shots of liquor per week     Comment: not currently    Allergies: No Known Allergies  No prescriptions prior to admission    Review of  Systems  Constitutional: Negative for fever.  Gastrointestinal: Negative for abdominal pain.  Genitourinary:       + discharge  Skin: Positive for itching and rash.   Physical Exam   Blood pressure 124/83, pulse 87, temperature 98.1 F (36.7 C), temperature source Oral, resp. rate 18, height 5\' 4"  (1.626 m), weight 222 lb 12.8 oz (101.061 kg), last menstrual period 07/28/2013, SpO2 100.00%, currently breastfeeding.  Physical Exam  Constitutional: She is oriented to person, place, and time. She appears well-developed and well-nourished. No distress.  HENT:  Head: Normocephalic.  Cardiovascular: Normal rate.   Respiratory: Effort normal.  GI: Soft. She exhibits no distension and no mass. There is no tenderness. There is no rebound and no guarding.  Genitourinary: There is rash on the right labia. There is rash on the left labia. Uterus is not enlarged and not tender. Cervix exhibits no motion tenderness, no discharge and no friability. Right adnexum displays no mass and no tenderness. Left adnexum displays no mass and no tenderness. No bleeding around the vagina. Vaginal discharge (small amount of thin, white discharge noted) found.  Neurological: She is alert and oriented to person, place, and time.  Skin: Skin is warm and dry.  Psychiatric: She has a normal mood and affect.   Results for orders placed during the hospital encounter of 08/26/13 (from the past 24 hour(s))  URINALYSIS, ROUTINE W REFLEX MICROSCOPIC     Status: Abnormal   Collection Time    08/26/13 12:40 PM      Result Value Ref Range   Color, Urine YELLOW  YELLOW   APPearance CLEAR  CLEAR   Specific Gravity, Urine >1.030 (*) 1.005 - 1.030   pH 6.0  5.0 - 8.0   Glucose, UA NEGATIVE  NEGATIVE mg/dL   Hgb urine dipstick NEGATIVE  NEGATIVE   Bilirubin Urine NEGATIVE  NEGATIVE   Ketones, ur NEGATIVE  NEGATIVE mg/dL   Protein, ur NEGATIVE  NEGATIVE mg/dL   Urobilinogen, UA 0.2  0.0 - 1.0 mg/dL   Nitrite NEGATIVE   NEGATIVE   Leukocytes, UA TRACE (*) NEGATIVE  URINE MICROSCOPIC-ADD ON     Status: Abnormal   Collection Time    08/26/13 12:40 PM      Result Value Ref Range   Squamous Epithelial / LPF FEW (*) RARE   WBC, UA 0-2  <3 WBC/hpf   RBC / HPF 0-2  <3 RBC/hpf   Bacteria, UA MANY (*) RARE   Urine-Other MUCOUS PRESENT    WET PREP, GENITAL     Status: Abnormal   Collection Time    08/26/13 12:45 PM      Result Value Ref Range   Yeast Wet Prep HPF POC NONE SEEN  NONE SEEN   Trich, Wet Prep NONE SEEN  NONE SEEN   Clue Cells Wet Prep HPF POC NONE SEEN  NONE SEEN   WBC, Wet Prep HPF POC MODERATE (*) NONE SEEN  POCT PREGNANCY, URINE     Status: None   Collection Time    08/26/13 12:47 PM      Result Value Ref Range   Preg Test, Ur NEGATIVE  NEGATIVE    MAU Course  Procedures None  MDM UPT - negative UA, wet prep and GC/Chlamydia today  Assessment and Plan  A: Cutaneous yeast   P: Discharge home Rx for Nystatin and Triamcinolone cream sent to patient's pharmacy Patient advised for external use only Patient advised to keep appointment to establish primary care with [facility] [place] as scheduled Patient may return to MAU as needed or if her condition were to change or worsen  Freddi StarrJulie N Ethier, PA-C  08/26/2013, 1:38 PM

## 2013-08-27 LAB — GC/CHLAMYDIA PROBE AMP
CT PROBE, AMP APTIMA: NEGATIVE
GC Probe RNA: NEGATIVE

## 2014-01-13 ENCOUNTER — Encounter (HOSPITAL_COMMUNITY): Payer: Self-pay | Admitting: *Deleted

## 2014-09-26 ENCOUNTER — Inpatient Hospital Stay (HOSPITAL_COMMUNITY)
Admission: AD | Admit: 2014-09-26 | Discharge: 2014-09-26 | Disposition: A | Discharge status: Home / Self Care | Payer: Medicaid Other | Source: Ambulatory Visit | Attending: Family Medicine | Admitting: Family Medicine

## 2014-09-26 ENCOUNTER — Inpatient Hospital Stay (HOSPITAL_COMMUNITY): Payer: Medicaid Other

## 2014-09-26 ENCOUNTER — Encounter (HOSPITAL_COMMUNITY): Payer: Self-pay | Admitting: *Deleted

## 2014-09-26 DIAGNOSIS — O26899 Other specified pregnancy related conditions, unspecified trimester: Secondary | ICD-10-CM

## 2014-09-26 DIAGNOSIS — R109 Unspecified abdominal pain: Secondary | ICD-10-CM | POA: Diagnosis present

## 2014-09-26 DIAGNOSIS — O26851 Spotting complicating pregnancy, first trimester: Secondary | ICD-10-CM

## 2014-09-26 DIAGNOSIS — O208 Other hemorrhage in early pregnancy: Secondary | ICD-10-CM | POA: Insufficient documentation

## 2014-09-26 DIAGNOSIS — Z87891 Personal history of nicotine dependence: Secondary | ICD-10-CM | POA: Diagnosis not present

## 2014-09-26 DIAGNOSIS — Z3A01 Less than 8 weeks gestation of pregnancy: Secondary | ICD-10-CM | POA: Diagnosis not present

## 2014-09-26 LAB — URINALYSIS, ROUTINE W REFLEX MICROSCOPIC
BILIRUBIN URINE: NEGATIVE
GLUCOSE, UA: NEGATIVE mg/dL
Hgb urine dipstick: NEGATIVE
KETONES UR: NEGATIVE mg/dL
Leukocytes, UA: NEGATIVE
Nitrite: NEGATIVE
PH: 6.5 (ref 5.0–8.0)
Protein, ur: NEGATIVE mg/dL
Specific Gravity, Urine: 1.02 (ref 1.005–1.030)
Urobilinogen, UA: 2 mg/dL — ABNORMAL HIGH (ref 0.0–1.0)

## 2014-09-26 LAB — WET PREP, GENITAL
Clue Cells Wet Prep HPF POC: NONE SEEN
Trich, Wet Prep: NONE SEEN
YEAST WET PREP: NONE SEEN

## 2014-09-26 LAB — POCT PREGNANCY, URINE: Preg Test, Ur: POSITIVE — AB

## 2014-09-26 LAB — CBC
HCT: 33.6 % — ABNORMAL LOW (ref 36.0–46.0)
Hemoglobin: 11.2 g/dL — ABNORMAL LOW (ref 12.0–15.0)
MCH: 26.5 pg (ref 26.0–34.0)
MCHC: 33.3 g/dL (ref 30.0–36.0)
MCV: 79.4 fL (ref 78.0–100.0)
PLATELETS: 330 10*3/uL (ref 150–400)
RBC: 4.23 MIL/uL (ref 3.87–5.11)
RDW: 14 % (ref 11.5–15.5)
WBC: 10.4 10*3/uL (ref 4.0–10.5)

## 2014-09-26 LAB — HCG, QUANTITATIVE, PREGNANCY: hCG, Beta Chain, Quant, S: 62577 m[IU]/mL — ABNORMAL HIGH (ref <5)

## 2014-09-26 NOTE — MAU Note (Addendum)
Pain in lower abd, started about a wk ago.  Started bleeding yesterday off and on .  Has been really really sick.  Some vag burning at times.  +HPT a wk ago, confirmed at health dept

## 2014-09-26 NOTE — Discharge Instructions (Signed)
First Trimester of Pregnancy The first trimester of pregnancy is from week 1 until the end of week 12 (months 1 through 3). During this time, your baby will begin to develop inside you. At 6-8 weeks, the eyes and face are formed, and the heartbeat can be seen on ultrasound. At the end of 12 weeks, all the baby's organs are formed. Prenatal care is all the medical care you receive before the birth of your baby. Make sure you get good prenatal care and follow all of your doctor's instructions. HOME CARE  Medicines  Take medicine only as told by your doctor. Some medicines are safe and some are not during pregnancy.  Take your prenatal vitamins as told by your doctor.  Take medicine that helps you poop (stool softener) as needed if your doctor says it is okay. Diet  Eat regular, healthy meals.  Your doctor will tell you the amount of weight gain that is right for you.  Avoid raw meat and uncooked cheese.  If you feel sick to your stomach (nauseous) or throw up (vomit):  Eat 4 or 5 small meals a day instead of 3 large meals.  Try eating a few soda crackers.  Drink liquids between meals instead of during meals.  If you have a hard time pooping (constipation):  Eat high-fiber foods like fresh vegetables, fruit, and whole grains.  Drink enough fluids to keep your pee (urine) clear or pale yellow. Activity and Exercise  Exercise only as told by your doctor. Stop exercising if you have cramps or pain in your lower belly (abdomen) or low back.  Try to avoid standing for long periods of time. Move your legs often if you must stand in one place for a long time.  Avoid heavy lifting.  Wear low-heeled shoes. Sit and stand up straight.  You can have sex unless your doctor tells you not to. Relief of Pain or Discomfort  Wear a good support bra if your breasts are sore.  Take warm water baths (sitz baths) to soothe pain or discomfort caused by hemorrhoids. Use hemorrhoid cream if your  doctor says it is okay.  Rest with your legs raised if you have leg cramps or low back pain.  Wear support hose if you have puffy, bulging veins (varicose veins) in your legs. Raise (elevate) your feet for 15 minutes, 3-4 times a day. Limit salt in your diet. Prenatal Care  Schedule your prenatal visits by the twelfth week of pregnancy.  Write down your questions. Take them to your prenatal visits.  Keep all your prenatal visits as told by your doctor. Safety  Wear your seat belt at all times when driving.  Make a list of emergency phone numbers. The list should include numbers for family, friends, the hospital, and police and fire departments. General Tips  Ask your doctor for a referral to a local prenatal class. Begin classes no later than at the start of month 6 of your pregnancy.  Ask for help if you need counseling or help with nutrition. Your doctor can give you advice or tell you where to go for help.  Do not use hot tubs, steam rooms, or saunas.  Do not douche or use tampons or scented sanitary pads.  Do not cross your legs for long periods of time.  Avoid litter boxes and soil used by cats.  Avoid all smoking, herbs, and alcohol. Avoid drugs not approved by your doctor.  Visit your dentist. At home, brush your teeth   with a soft toothbrush. Be gentle when you floss. GET HELP IF:  You are dizzy.  You have mild cramps or pressure in your lower belly.  You have a nagging pain in your belly area.  You continue to feel sick to your stomach, throw up, or have watery poop (diarrhea).  You have a bad smelling fluid coming from your vagina.  You have pain with peeing (urination).  You have increased puffiness (swelling) in your face, hands, legs, or ankles. GET HELP RIGHT AWAY IF:   You have a fever.  You are leaking fluid from your vagina.  You have spotting or bleeding from your vagina.  You have very bad belly cramping or pain.  You gain or lose weight  rapidly.  You throw up blood. It may look like coffee grounds.  You are around people who have German measles, fifth disease, or chickenpox.  You have a very bad headache.  You have shortness of breath.  You have any kind of trauma, such as from a fall or a car accident. Document Released: 08/17/2007 Document Revised: 07/15/2013 Document Reviewed: 01/08/2013 Exit[employer] Patient Information 2015 [employer]. This information is not intended to replace advice given to you by your health care provider. Make sure you discuss any questions you have with your health care provider.  

## 2014-09-26 NOTE — MAU Provider Note (Signed)
History     CSN: [id number]  Arrival date and time: 09/26/14 1245   First Provider Initiated Contact with Patient 09/26/14 1310      Chief Complaint  Patient presents with  . Abdominal Pain  . Vaginal Bleeding  . Emesis   HPI  Ms. BLANCHIE ZELEZNIK is a 29 y.o. G3P1001 at [redacted]w[redacted]d who presents to MAU today with complaint of intermittent spotting and lower abdominal cramping. She rates cramping at 7/10 at the worst, but is having no pain now. She states spotting only, denies heavy bleeding or clots. She states symptoms have been present x 2 days. She states pain is worse in the LLQ than in the RLQ. She also has complaint of vaginal burning. She denies vaginal discharge, UTI symptoms, fever, vomiting, diarrhea or constipation. She states frequent mild nausea. LMP 08/06/14  OB History    Gravida Para Term Preterm AB TAB SAB Ectopic Multiple Living   0 0 0 0 0 0 1      Past Medical History  Diagnosis Date  . Normal pregnancy, incidental 07/18/2011  . Headache(784.0)   . Hx of chlamydia infection     29 years old  . H/O menorrhagia   . Hx of dysmenorrhea   . BV (bacterial vaginosis)   . Abnormal Pap smear     age 26     Past Surgical History  Procedure Laterality Date  . No past surgeries    . Cesarean section  10/26/2011    Procedure: CESAREAN SECTION;  Surgeon: Michael Litter, MD;  Location: [facility];  Service: Gynecology;  Laterality: N/A;    Family History  Problem Relation Age of Onset  . Hypertension Mother   . Depression Mother   . Migraines Mother   . Anesthesia problems Neg Hx   . Alcohol abuse Maternal Grandmother   . Alcohol abuse Maternal Grandfather   . Alcohol abuse Paternal Grandfather     History  Substance Use Topics  . Smoking status: Former Smoker -- 1.00 packs/day    Types: Cigarettes  . Smokeless tobacco: Never Used  . Alcohol Use: 1.2 oz/week    2 Shots of liquor per week     Comment: not currently while pregnant     Allergies: No Known  Allergies  No prescriptions prior to admission    Review of Systems  Constitutional: Negative for fever and malaise/fatigue.  Gastrointestinal: Positive for abdominal pain. Negative for nausea, vomiting, diarrhea and constipation.  Genitourinary: Negative for dysuria, urgency and frequency.       + vaginal bleeding Neg - vaginal discharge   Physical Exam   Blood pressure 118/72, pulse 84, temperature 98.6 F (37 C), temperature source Oral, resp. rate 18, weight 215 lb 3.2 oz (97.614 kg), last menstrual period 08/06/2014, unknown if currently breastfeeding.  Physical Exam  Nursing note and vitals reviewed. Constitutional: She is oriented to person, place, and time. She appears well-developed and well-nourished. No distress.  HENT:  Head: Normocephalic and atraumatic.  Cardiovascular: Normal rate.   Respiratory: Effort normal.  GI: Soft. She exhibits no distension and no mass. There is tenderness (mild tenderness to palpation of the LLQ). There is no rebound and no guarding.  Genitourinary: Uterus is not enlarged and not tender. Cervix exhibits no motion tenderness, no discharge and no friability. Right adnexum displays no mass and no tenderness. Left adnexum displays tenderness (mild). Left adnexum displays no mass. No bleeding in the vagina. Vaginal discharge (small amount of  thick, white discharge) found.  Neurological: She is alert and oriented to person, place, and time.  Skin: Skin is warm and dry. No erythema.  Psychiatric: She has a normal mood and affect.   Results for orders placed or performed during the hospital encounter of 09/26/14 (from the past 24 hour(s))  Urinalysis, Routine w reflex microscopic (not at [facility])     Status: Abnormal   Collection Time: 09/26/14  1:00 PM  Result Value Ref Range   Color, Urine YELLOW YELLOW   APPearance CLEAR CLEAR   Specific Gravity, Urine 1.020 1.005 - 1.030   pH 6.5 5.0 - 8.0   Glucose, UA NEGATIVE NEGATIVE mg/dL   Hgb urine  dipstick NEGATIVE NEGATIVE   Bilirubin Urine NEGATIVE NEGATIVE   Ketones, ur NEGATIVE NEGATIVE mg/dL   Protein, ur NEGATIVE NEGATIVE mg/dL   Urobilinogen, UA 2.0 (H) 0.0 - 1.0 mg/dL   Nitrite NEGATIVE NEGATIVE   Leukocytes, UA NEGATIVE NEGATIVE  Pregnancy, urine POC     Status: Abnormal   Collection Time: 09/26/14  1:05 PM  Result Value Ref Range   Preg Test, Ur POSITIVE (A) NEGATIVE  CBC     Status: Abnormal   Collection Time: 09/26/14  1:11 PM  Result Value Ref Range   WBC 10.4 4.0 - 10.5 K/uL   RBC 4.23 3.87 - 5.11 MIL/uL   Hemoglobin 11.2 (L) 12.0 - 15.0 g/dL   HCT 28.4 (L) 13.2 - 44.0 %   MCV 79.4 78.0 - 100.0 fL   MCH 26.5 26.0 - 34.0 pg   MCHC 33.3 30.0 - 36.0 g/dL   RDW 10.2 72.5 - 36.6 %   Platelets 330 150 - 400 K/uL  hCG, quantitative, pregnancy     Status: Abnormal   Collection Time: 09/26/14  1:25 PM  Result Value Ref Range   hCG, Beta Chain, Quant, S 62577 (H) <5 mIU/mL  Wet prep, genital     Status: Abnormal   Collection Time: 09/26/14  1:35 PM  Result Value Ref Range   Yeast Wet Prep HPF POC NONE SEEN NONE SEEN   Trich, Wet Prep NONE SEEN NONE SEEN   Clue Cells Wet Prep HPF POC NONE SEEN NONE SEEN   WBC, Wet Prep HPF POC MODERATE (A) NONE SEEN   US Ob Comp Less 14 Wks  09/26/2014   CLINICAL DATA:  Patient with pelvic pain for 2 days, left-greater-than-right.  EXAM: OBSTETRIC <14 WK [place] AND TRANSVAGINAL OB US  TECHNIQUE: Both transabdominal and transvaginal ultrasound examinations were performed for complete evaluation of the gestation as well as the maternal uterus, adnexal regions, and pelvic cul-de-sac. Transvaginal technique was performed to assess early pregnancy.  COMPARISON:  Pelvic ultrasound 05/14/2011  FINDINGS: Intrauterine gestational sac: Visualized/normal in shape.  Yolk sac:  Present  Embryo:  Present  Cardiac Activity: Present  Heart Rate: 144  bpm  CRL:  12.3  mm   7 w   3 d new [place] EDC: 05/12/2015  Maternal uterus/adnexae: Small subchorionic  hemorrhage. The right and left ovary are unremarkable. No free fluid in the pelvis.  IMPRESSION: Single live intrauterine gestation 7 weeks 2 days by LMP.   Electronically Signed   By: Annia Belt M.D.   On: 09/26/2014 14:18   US Ob Transvaginal  09/26/2014   CLINICAL DATA:  Patient with pelvic pain for 2 days, left-greater-than-right.  EXAM: OBSTETRIC <14 WK [place] AND TRANSVAGINAL OB US  TECHNIQUE: Both transabdominal and transvaginal ultrasound examinations were performed for complete evaluation  of the gestation as well as the maternal uterus, adnexal regions, and pelvic cul-de-sac. Transvaginal technique was performed to assess early pregnancy.  COMPARISON:  Pelvic ultrasound 05/14/2011  FINDINGS: Intrauterine gestational sac: Visualized/normal in shape.  Yolk sac:  Present  Embryo:  Present  Cardiac Activity: Present  Heart Rate: 144  bpm  CRL:  12.3  mm   7 w   3 d new US EDC: 05/12/2015  Maternal uterus/adnexae: Small subchorionic hemorrhage. The right and left ovary are unremarkable. No free fluid in the pelvis.  IMPRESSION: Single live intrauterine gestation 7 weeks 2 days by LMP.   Electronically Signed   By: Annia Beltrew  Davis M.D.   On: 09/26/2014 14:18    MAU Course  Procedures None  MDM A + blood type from previous visit in Epic +UPT UA, wet prep, GC/chlamydia, CBC, quant hCG, HIV, RPR and US today to rule out ectopic pregnancy  Assessment and Plan  A: SIUP at 3526w3d Small subchorionic hemorrhage Abdominal pain in pregnancy  P: Discharge home Bleeding/first trimester precautions discussed Patient advised to follow-up with [facility] as planned to start prenatal care Patient may return to MAU as needed or if her condition were to change or worsen   Marny LowensteinJulie N Alejo Beamer, PA-C  09/26/2014, 3:04 PM

## 2014-09-27 LAB — HIV ANTIBODY (ROUTINE TESTING W REFLEX): HIV Screen 4th Generation wRfx: NONREACTIVE

## 2014-09-27 LAB — RPR: RPR Ser Ql: NONREACTIVE

## 2014-09-29 LAB — GC/CHLAMYDIA PROBE AMP (~~LOC~~) NOT AT ARMC
Chlamydia: NEGATIVE
Neisseria Gonorrhea: NEGATIVE

## 2014-10-22 LAB — OB RESULTS CONSOLE ABO/RH: RH Type: POSITIVE

## 2014-10-22 LAB — OB RESULTS CONSOLE RPR: RPR: NONREACTIVE

## 2014-10-22 LAB — OB RESULTS CONSOLE GC/CHLAMYDIA
CHLAMYDIA, DNA PROBE: NEGATIVE
Gonorrhea: NEGATIVE

## 2014-10-22 LAB — OB RESULTS CONSOLE RUBELLA ANTIBODY, IGM: Rubella: IMMUNE

## 2014-10-22 LAB — OB RESULTS CONSOLE ANTIBODY SCREEN: ANTIBODY SCREEN: NEGATIVE

## 2014-10-22 LAB — OB RESULTS CONSOLE HEPATITIS B SURFACE ANTIGEN: Hepatitis B Surface Ag: NEGATIVE

## 2014-10-22 LAB — OB RESULTS CONSOLE HIV ANTIBODY (ROUTINE TESTING): HIV: NONREACTIVE

## 2015-03-15 NOTE — L&D Delivery Note (Signed)
Operative Delivery Note At 7:47 PM a viable female was delivered via Vaginal, Vacuum Investment banker, operational).  Presentation: vertex; Position: Right,, Occiput,, Posterior; Station: +2.  Verbal consent: obtained from patient.  Risks and benefits discussed in detail.  Risks include, but are not limited to the risks of anesthesia, bleeding, infection, damage to maternal tissues, fetal cephalhematoma.  There is also the risk of inability to effect vaginal delivery of the head, or shoulder dystocia that cannot be resolved by established maneuvers, leading to the need for emergency cesarean section.  Vacuum assistance for repetetive decels with ctx and tachycardia between ctx.  APGAR: 7, 9; weight pending.   Placenta status: Intact, Spontaneous.   Cord: 3 vessels with the following complications: None.  Anesthesia: Local Epidural  Instruments: Mityvac mushroom cup Episiotomy: None Lacerations: 2nd degree Suture Repair: 3.0 vicryl rapide Est. Blood Loss (mL): 300  Mom to postpartum.  Baby to Couplet care / Skin to Skin.  Will continue Unasyn for 2 more doses for chorio.  Will do circumcision in the office.  Avri Paiva D 05/06/2015, 8:16 PM

## 2015-03-22 ENCOUNTER — Encounter (HOSPITAL_COMMUNITY): Payer: Self-pay | Admitting: *Deleted

## 2015-03-22 ENCOUNTER — Inpatient Hospital Stay (HOSPITAL_COMMUNITY)
Admission: EM | Admit: 2015-03-22 | Discharge: 2015-03-22 | Disposition: A | Discharge status: Home / Self Care | Payer: Medicaid Other | Source: Ambulatory Visit | Attending: Obstetrics and Gynecology | Admitting: Obstetrics and Gynecology

## 2015-03-22 DIAGNOSIS — O219 Vomiting of pregnancy, unspecified: Secondary | ICD-10-CM | POA: Diagnosis not present

## 2015-03-22 DIAGNOSIS — K529 Noninfective gastroenteritis and colitis, unspecified: Secondary | ICD-10-CM | POA: Insufficient documentation

## 2015-03-22 DIAGNOSIS — Z3A32 32 weeks gestation of pregnancy: Secondary | ICD-10-CM | POA: Diagnosis not present

## 2015-03-22 DIAGNOSIS — R109 Unspecified abdominal pain: Secondary | ICD-10-CM | POA: Insufficient documentation

## 2015-03-22 DIAGNOSIS — Z87891 Personal history of nicotine dependence: Secondary | ICD-10-CM | POA: Diagnosis not present

## 2015-03-22 DIAGNOSIS — R112 Nausea with vomiting, unspecified: Secondary | ICD-10-CM | POA: Insufficient documentation

## 2015-03-22 LAB — URINALYSIS, ROUTINE W REFLEX MICROSCOPIC
BILIRUBIN URINE: NEGATIVE
Glucose, UA: NEGATIVE mg/dL
HGB URINE DIPSTICK: NEGATIVE
KETONES UR: 15 mg/dL — AB
Leukocytes, UA: NEGATIVE
Nitrite: NEGATIVE
PROTEIN: NEGATIVE mg/dL
Specific Gravity, Urine: 1.025 (ref 1.005–1.030)
pH: 6 (ref 5.0–8.0)

## 2015-03-22 MED ORDER — LACTATED RINGERS IV BOLUS (SEPSIS)
1000.0000 mL | Freq: Once | INTRAVENOUS | Status: AC
  Administered 2015-03-22: 1000 mL via INTRAVENOUS

## 2015-03-22 MED ORDER — PROMETHAZINE HCL 25 MG RE SUPP: 25.0000 mg | Freq: Four times a day (QID) | RECTAL | Status: DC | PRN

## 2015-03-22 MED ORDER — PROMETHAZINE HCL 25 MG/ML IJ SOLN
25.0000 mg | Freq: Once | INTRAMUSCULAR | Status: AC
  Administered 2015-03-22: 25 mg via INTRAVENOUS
  Filled 2015-03-22: qty 1

## 2015-03-22 MED ORDER — ONDANSETRON HCL 4 MG/2ML IJ SOLN
4.0000 mg | Freq: Once | INTRAMUSCULAR | Status: AC
  Administered 2015-03-22: 4 mg via INTRAVENOUS
  Filled 2015-03-22: qty 2

## 2015-03-22 MED ORDER — FAMOTIDINE IN NACL 20-0.9 MG/50ML-% IV SOLN
20.0000 mg | Freq: Once | INTRAVENOUS | Status: AC
  Administered 2015-03-22: 20 mg via INTRAVENOUS
  Filled 2015-03-22: qty 50

## 2015-03-22 NOTE — MAU Provider Note (Signed)
History     CSN: [id number]  Arrival date and time: 03/22/15 1331   First Provider Initiated Contact with Patient 03/22/15 1506      Chief Complaint  Patient presents with  . Contractions  . Nausea   HPI   Ms.Angela Brock is a 30 y.o. female G2P1001 at [redacted]w[redacted]d presenting to MAU with N/V/D. No one else in her house is sick with these symptoms. She has had 4 episodes of diarrhea and 4 episodes of vomiting, with some mild abdominal cramping. She has not taken anything for the symptoms.   + fetal movement Denies vaginal bleeding or leaking of fluid.   + Occasional, mild abdominal cramping   OB History    Gravida Para Term Preterm AB TAB SAB Ectopic Multiple Living   2 1 1  0 0 0 0 0 0 1      Past Medical History  Diagnosis Date  . Normal pregnancy, incidental 07/18/2011  . Headache(784.0)   . Hx of chlamydia infection     30 years old  . H/O menorrhagia   . Hx of dysmenorrhea   . BV (bacterial vaginosis)   . Abnormal Pap smear     age 89     Past Surgical History  Procedure Laterality Date  . Cesarean section  10/26/2011    Procedure: CESAREAN SECTION;  Surgeon: Michael Litter, MD;  Location: [facility];  Service: Gynecology;  Laterality: N/A;    Family History  Problem Relation Age of Onset  . Hypertension Mother   . Depression Mother   . Migraines Mother   . Anesthesia problems Neg Hx   . Alcohol abuse Maternal Grandmother   . Alcohol abuse Maternal Grandfather   . Alcohol abuse Paternal Grandfather     Social History  Substance Use Topics  . Smoking status: Former Smoker -- 1.00 packs/day    Types: Cigarettes  . Smokeless tobacco: Never Used  . Alcohol Use: No     Comment: not currently while pregnant     Allergies: No Known Allergies  Prescriptions prior to admission  Medication Sig Dispense Refill Last Dose  . acetaminophen (TYLENOL) 325 MG tablet Take 650 mg by mouth every 6 (six) hours as needed.   prn  . ibuprofen (ADVIL,MOTRIN) 200 MG tablet  Take 200 mg by mouth every 6 (six) hours as needed.   prn  . nystatin cream (MYCOSTATIN) Apply to affected area 2 times daily (Patient not taking: Reported on 03/22/2015) 15 g 0 Not Taking at Unknown time  . triamcinolone cream (KENALOG) 0.1 % Apply 1 application topically 2 (two) times daily. (Patient not taking: Reported on 03/22/2015) 30 g 0 Not Taking at Unknown time   Results for orders placed or performed during the hospital encounter of 03/22/15 (from the past 48 hour(s))  Urinalysis, Routine w reflex microscopic (not at [facility])     Status: Abnormal   Collection Time: 03/22/15  2:39 PM  Result Value Ref Range   Color, Urine YELLOW YELLOW   APPearance CLEAR CLEAR   Specific Gravity, Urine 1.025 1.005 - 1.030   pH 6.0 5.0 - 8.0   Glucose, UA NEGATIVE NEGATIVE mg/dL   Hgb urine dipstick NEGATIVE NEGATIVE   Bilirubin Urine NEGATIVE NEGATIVE   Ketones, ur 15 (A) NEGATIVE mg/dL   Protein, ur NEGATIVE NEGATIVE mg/dL   Nitrite NEGATIVE NEGATIVE   Leukocytes, UA NEGATIVE NEGATIVE    Comment: MICROSCOPIC NOT DONE ON URINES WITH NEGATIVE PROTEIN, BLOOD, LEUKOCYTES, NITRITE, OR GLUCOSE <1000  mg/dL.    Review of Systems  Constitutional: Negative for fever and chills.  Gastrointestinal: Positive for heartburn, nausea, vomiting and diarrhea. Negative for abdominal pain.  Genitourinary: Negative for dysuria and urgency.   Physical Exam   Blood pressure 129/71, pulse 105, temperature 98.4 F (36.9 C), temperature source Oral, height 5\' 3"  (1.6 m), weight 238 lb 9.6 oz (108.228 kg), last menstrual period 08/06/2014, unknown if currently breastfeeding.  Physical Exam  Constitutional: She is oriented to person, place, and time. She appears well-developed and well-nourished. No distress.  HENT:  Head: Normocephalic.  Eyes: Pupils are equal, round, and reactive to light.  Cardiovascular: Normal rate.   GI: Soft. There is no tenderness. There is no rebound and no guarding.  Musculoskeletal: Normal  range of motion.  Neurological: She is alert and oriented to person, place, and time.  Skin: Skin is warm. She is not diaphoretic.  Psychiatric: Her behavior is normal.    Fetal Tracing: Baseline: 125 bpm  Variability: Moderate  Accelerations: 15x15 Decelerations: a few quick variables. Toco: None  MAU Course  Procedures  None  MDM  UA LR bolus Zofran IV  Phenergan IV Pepcid IV  Patient tolerating ice chips and asking to go home.  Discussed patient with Dr. Jackelyn KnifeMeisinger.    Assessment and Plan   A:  1. Gastroenteritis, acute   2. Nausea/vomiting in pregnancy     P:  Discharge home in stable condition RX: Phenergan suppository  Follow up with Dr. Jackelyn KnifeMeisinger as scheduled Kick counts Return to MAU if symptoms worsen Small meals.  Increase PO fluid intake     Duane LopeJennifer I Fleurette Woolbright, NP 03/22/2015 4:53 PM

## 2015-03-22 NOTE — MAU Note (Signed)
Braxton Hicks contractions since last night, vomiting and diarrhea since last, 4 episodes of diarrhea.

## 2015-03-22 NOTE — Discharge Instructions (Signed)
Norovirus Infection A norovirus infection is caused by exposure to a virus in a group of similar viruses (noroviruses). This type of infection causes inflammation in your stomach and intestines (gastroenteritis). Norovirus is the most common cause of gastroenteritis. It also causes food poisoning. Anyone can get a norovirus infection. It spreads very easily (contagious). You can get it from contaminated food, water, surfaces, or other people. Norovirus is found in the stool or vomit of infected people. You can spread the infection as soon as you feel sick until 2 weeks after you recover.  Symptoms usually begin within 2 days after you become infected. Most norovirus symptoms affect the digestive system. CAUSES Norovirus infection is caused by contact with norovirus. You can catch norovirus if you:  Eat or drink something contaminated with norovirus.  Touch surfaces or objects contaminated with norovirus and then put your hand in your mouth.  Have direct contact with an infected person who has symptoms.  Share food, drink, or utensils with someone with who is sick with norovirus. SIGNS AND SYMPTOMS Symptoms of norovirus may include: 1. Nausea. 2. Vomiting. 3. Diarrhea. 4. Stomach cramps. 5. Fever. 6. Chills. 7. Headache. 8. Muscle aches. 9. Tiredness. DIAGNOSIS Your health care provider may suspect norovirus based on your symptoms and physical exam. Your health care provider may also test a sample of your stool or vomit for the virus.  TREATMENT There is no specific treatment for norovirus. Most people get better without treatment in about 2 days. HOME CARE INSTRUCTIONS  Replace lost fluids by drinking plenty of water or rehydration fluids containing important minerals called electrolytes. This prevents dehydration. Drink enough fluid to keep your urine clear or pale yellow.  Do not prepare food for others while you are infected. Wait at least 3 days after recovering from the illness  to do that. PREVENTION   Wash your hands often, especially after using the toilet or changing a diaper.  Wash fruits and vegetables thoroughly before preparing or serving them.  Throw out any food that a sick person may have touched.  Disinfect contaminated surfaces immediately after someone in the household has been sick. Use a bleach-based household cleaner.  Immediately remove and wash soiled clothes or sheets. SEEK MEDICAL CARE IF:  Your vomiting, diarrhea, and stomach pain is getting worse.  Your symptoms of norovirus do not go away after 2-3 days. SEEK IMMEDIATE MEDICAL CARE IF:  You develop symptoms of dehydration that do not improve with fluid replacement. This may include:  Excessive sleepiness.  Lack of tears.  Dry mouth.  Dizziness when standing.  Weak pulse.   This information is not intended to replace advice given to you by your health care provider. Make sure you discuss any questions you have with your health care provider.   Document Released: 05/21/2002 Document Revised: 03/21/2014 Document Reviewed: 08/08/2013 Elsevier Interactive Patient Education 2016 [employer] Fetal Movement Counts Patient Name: __________________________________________________ Patient Due Date: ____________________ Performing a fetal movement count is highly recommended in high-risk pregnancies, but it is good for every pregnant woman to do. Your health care provider may ask you to start counting fetal movements at 28 weeks of the pregnancy. Fetal movements often increase:  After eating a full meal.  After physical activity.  After eating or drinking something sweet or cold.  At rest. Pay attention to when you feel the baby is most active. This will help you notice a pattern of your baby's sleep and wake cycles and what factors contribute to an  increase in fetal movement. It is important to perform a fetal movement count at the same time each day when your baby is normally  most active.  HOW TO COUNT FETAL MOVEMENTS 10. Find a quiet and comfortable area to sit or lie down on your left side. Lying on your left side provides the best blood and oxygen circulation to your baby. 11. Write down the day and time on a sheet of paper or in a journal. 12. Start counting kicks, flutters, swishes, rolls, or jabs in a 2-hour period. You should feel at least 10 movements within 2 hours. 13. If you do not feel 10 movements in 2 hours, wait 2-3 hours and count again. Look for a change in the pattern or not enough counts in 2 hours. SEEK MEDICAL CARE IF:  You feel less than 10 counts in 2 hours, tried twice.  There is no movement in over an hour.  The pattern is changing or taking longer each day to reach 10 counts in 2 hours.  You feel the baby is not moving as he or she usually does. Date: ____________ Movements: ____________ Start time: ____________ Angela Brock time: ____________  Date: ____________ Movements: ____________ Start time: ____________ Angela Brock time: ____________ Date: ____________ Movements: ____________ Start time: ____________ Angela Brock time: ____________ Date: ____________ Movements: ____________ Start time: ____________ Angela Brock time: ____________ Date: ____________ Movements: ____________ Start time: ____________ Angela Brock time: ____________ Date: ____________ Movements: ____________ Start time: ____________ Angela Brock time: ____________ Date: ____________ Movements: ____________ Start time: ____________ Angela Brock time: ____________ Date: ____________ Movements: ____________ Start time: ____________ Angela Brock time: ____________  Date: ____________ Movements: ____________ Start time: ____________ Angela Brock time: ____________ Date: ____________ Movements: ____________ Start time: ____________ Angela Brock time: ____________ Date: ____________ Movements: ____________ Start time: ____________ Angela Brock time: ____________ Date: ____________ Movements: ____________ Start time: ____________ Angela Brock  time: ____________ Date: ____________ Movements: ____________ Start time: ____________ Angela Brock time: ____________ Date: ____________ Movements: ____________ Start time: ____________ Angela Brock time: ____________ Date: ____________ Movements: ____________ Start time: ____________ Angela Brock time: ____________  Date: ____________ Movements: ____________ Start time: ____________ Angela Brock time: ____________ Date: ____________ Movements: ____________ Start time: ____________ Angela Brock time: ____________ Date: ____________ Movements: ____________ Start time: ____________ Angela Brock time: ____________ Date: ____________ Movements: ____________ Start time: ____________ Angela Brock time: ____________ Date: ____________ Movements: ____________ Start time: ____________ Angela Brock time: ____________ Date: ____________ Movements: ____________ Start time: ____________ Angela Brock time: ____________ Date: ____________ Movements: ____________ Start time: ____________ Angela Brock time: ____________  Date: ____________ Movements: ____________ Start time: ____________ Angela Brock time: ____________ Date: ____________ Movements: ____________ Start time: ____________ Angela Brock time: ____________ Date: ____________ Movements: ____________ Start time: ____________ Angela Brock time: ____________ Date: ____________ Movements: ____________ Start time: ____________ Angela Brock time: ____________ Date: ____________ Movements: ____________ Start time: ____________ Angela Brock time: ____________ Date: ____________ Movements: ____________ Start time: ____________ Angela Brock time: ____________ Date: ____________ Movements: ____________ Start time: ____________ Angela Brock time: ____________  Date: ____________ Movements: ____________ Start time: ____________ Angela Brock time: ____________ Date: ____________ Movements: ____________ Start time: ____________ Angela Brock time: ____________ Date: ____________ Movements: ____________ Start time: ____________ Angela Brock time: ____________ Date: ____________  Movements: ____________ Start time: ____________ Angela Brock time: ____________ Date: ____________ Movements: ____________ Start time: ____________ Angela Brock time: ____________ Date: ____________ Movements: ____________ Start time: ____________ Angela Brock time: ____________ Date: ____________ Movements: ____________ Start time: ____________ Angela Brock time: ____________  Date: ____________ Movements: ____________ Start time: ____________ Angela Brock time: ____________ Date: ____________ Movements: ____________ Start time: ____________ Angela Brock time: ____________ Date: ____________ Movements: ____________ Start time: ____________ Angela Brock time: ____________ Date: ____________ Movements: ____________ Start time: ____________ Angela Brock time: ____________ Date: ____________  Movements: ____________ Start time: ____________ Angela MartinFinish time: ____________ Date: ____________ Movements: ____________ Start time: ____________ Angela MartinFinish time: ____________ Date: ____________ Movements: ____________ Start time: ____________ Angela MartinFinish time: ____________  Date: ____________ Movements: ____________ Start time: ____________ Angela MartinFinish time: ____________ Date: ____________ Movements: ____________ Start time: ____________ Angela MartinFinish time: ____________ Date: ____________ Movements: ____________ Start time: ____________ Angela MartinFinish time: ____________ Date: ____________ Movements: ____________ Start time: ____________ Angela MartinFinish time: ____________ Date: ____________ Movements: ____________ Start time: ____________ Angela MartinFinish time: ____________ Date: ____________ Movements: ____________ Start time: ____________ Angela MartinFinish time: ____________ Date: ____________ Movements: ____________ Start time: ____________ Angela MartinFinish time: ____________  Date: ____________ Movements: ____________ Start time: ____________ Angela MartinFinish time: ____________ Date: ____________ Movements: ____________ Start time: ____________ Angela MartinFinish time: ____________ Date: ____________ Movements: ____________ Start time:  ____________ Angela MartinFinish time: ____________ Date: ____________ Movements: ____________ Start time: ____________ Angela MartinFinish time: ____________ Date: ____________ Movements: ____________ Start time: ____________ Angela MartinFinish time: ____________ Date: ____________ Movements: ____________ Start time: ____________ Angela MartinFinish time: ____________   This information is not intended to replace advice given to you by your health care provider. Make sure you discuss any questions you have with your health care provider.   Document Released: 03/30/2006 Document Revised: 03/21/2014 Document Reviewed: 12/26/2011 Elsevier Interactive Patient Education [employer] Else[employer] Inc[employer]

## 2015-03-23 NOTE — Progress Notes (Signed)
FHT from 1-8 reviewed.  Reactive NST, no significant decels, no regular ctx.

## 2015-04-08 LAB — OB RESULTS CONSOLE GBS: GBS: POSITIVE

## 2015-05-06 ENCOUNTER — Inpatient Hospital Stay (HOSPITAL_COMMUNITY): Payer: Medicaid Other | Admitting: Anesthesiology

## 2015-05-06 ENCOUNTER — Encounter (HOSPITAL_COMMUNITY): Payer: Self-pay

## 2015-05-06 ENCOUNTER — Inpatient Hospital Stay (HOSPITAL_COMMUNITY)
Admission: AD | Admit: 2015-05-06 | Discharge: 2015-05-08 | DRG: 775 | Disposition: A | Discharge status: Home / Self Care | Payer: Medicaid Other | Source: Ambulatory Visit | Attending: Obstetrics and Gynecology | Admitting: Obstetrics and Gynecology

## 2015-05-06 DIAGNOSIS — Z87891 Personal history of nicotine dependence: Secondary | ICD-10-CM | POA: Diagnosis not present

## 2015-05-06 DIAGNOSIS — O411230 Chorioamnionitis, third trimester, not applicable or unspecified: Secondary | ICD-10-CM | POA: Diagnosis present

## 2015-05-06 DIAGNOSIS — Z3A39 39 weeks gestation of pregnancy: Secondary | ICD-10-CM | POA: Diagnosis not present

## 2015-05-06 DIAGNOSIS — O99824 Streptococcus B carrier state complicating childbirth: Secondary | ICD-10-CM | POA: Diagnosis present

## 2015-05-06 DIAGNOSIS — O99214 Obesity complicating childbirth: Secondary | ICD-10-CM | POA: Diagnosis present

## 2015-05-06 DIAGNOSIS — Z8249 Family history of ischemic heart disease and other diseases of the circulatory system: Secondary | ICD-10-CM | POA: Diagnosis not present

## 2015-05-06 DIAGNOSIS — O4292 Full-term premature rupture of membranes, unspecified as to length of time between rupture and onset of labor: Secondary | ICD-10-CM | POA: Diagnosis present

## 2015-05-06 DIAGNOSIS — O34211 Maternal care for low transverse scar from previous cesarean delivery: Secondary | ICD-10-CM | POA: Diagnosis present

## 2015-05-06 DIAGNOSIS — O4290 Premature rupture of membranes, unspecified as to length of time between rupture and onset of labor, unspecified weeks of gestation: Secondary | ICD-10-CM | POA: Diagnosis present

## 2015-05-06 DIAGNOSIS — Z6841 Body Mass Index (BMI) 40.0 and over, adult: Secondary | ICD-10-CM

## 2015-05-06 DIAGNOSIS — O34219 Maternal care for unspecified type scar from previous cesarean delivery: Secondary | ICD-10-CM

## 2015-05-06 LAB — TYPE AND SCREEN
ABO/RH(D): A POS
ANTIBODY SCREEN: NEGATIVE

## 2015-05-06 LAB — RPR: RPR: NONREACTIVE

## 2015-05-06 LAB — CBC
HCT: 30.6 % — ABNORMAL LOW (ref 36.0–46.0)
Hemoglobin: 10.3 g/dL — ABNORMAL LOW (ref 12.0–15.0)
MCH: 26.6 pg (ref 26.0–34.0)
MCHC: 33.7 g/dL (ref 30.0–36.0)
MCV: 79.1 fL (ref 78.0–100.0)
PLATELETS: 365 10*3/uL (ref 150–400)
RBC: 3.87 MIL/uL (ref 3.87–5.11)
RDW: 15.1 % (ref 11.5–15.5)
WBC: 13.6 10*3/uL — AB (ref 4.0–10.5)

## 2015-05-06 LAB — POCT FERN TEST: POCT Fern Test: POSITIVE

## 2015-05-06 MED ORDER — LACTATED RINGERS IV SOLN
500.0000 mL | Freq: Once | INTRAVENOUS | Status: AC
  Administered 2015-05-06: 500 mL via INTRAVENOUS

## 2015-05-06 MED ORDER — FENTANYL 2.5 MCG/ML BUPIVACAINE 1/10 % EPIDURAL INFUSION (WH - ANES)
14.0000 mL/h | INTRAMUSCULAR | Status: DC | PRN
  Administered 2015-05-06 (×2): 14 mL/h via EPIDURAL
  Filled 2015-05-06 (×2): qty 125

## 2015-05-06 MED ORDER — ONDANSETRON HCL 4 MG PO TABS: 4.0000 mg | ORAL_TABLET | ORAL | Status: DC | PRN

## 2015-05-06 MED ORDER — PENICILLIN G POTASSIUM 5000000 UNITS IJ SOLR
5.0000 10*6.[IU] | Freq: Once | INTRAVENOUS | Status: AC
  Administered 2015-05-06: 5 10*6.[IU] via INTRAVENOUS
  Filled 2015-05-06: qty 5

## 2015-05-06 MED ORDER — CITRIC ACID-SODIUM CITRATE 334-500 MG/5ML PO SOLN
30.0000 mL | ORAL | Status: DC | PRN
  Filled 2015-05-06: qty 15

## 2015-05-06 MED ORDER — ACETAMINOPHEN 325 MG PO TABS
650.0000 mg | ORAL_TABLET | ORAL | Status: DC | PRN
  Administered 2015-05-06: 650 mg via ORAL
  Filled 2015-05-06: qty 2

## 2015-05-06 MED ORDER — WITCH HAZEL-GLYCERIN EX PADS: 1.0000 "application " | MEDICATED_PAD | CUTANEOUS | Status: DC | PRN

## 2015-05-06 MED ORDER — DIPHENHYDRAMINE HCL 25 MG PO CAPS: 25.0000 mg | ORAL_CAPSULE | Freq: Four times a day (QID) | ORAL | Status: DC | PRN

## 2015-05-06 MED ORDER — ZOLPIDEM TARTRATE 5 MG PO TABS: 5.0000 mg | ORAL_TABLET | Freq: Every evening | ORAL | Status: DC | PRN

## 2015-05-06 MED ORDER — PHENYLEPHRINE 40 MCG/ML (10ML) SYRINGE FOR IV PUSH (FOR BLOOD PRESSURE SUPPORT)
80.0000 ug | PREFILLED_SYRINGE | INTRAVENOUS | Status: DC | PRN
  Administered 2015-05-06: 80 ug via INTRAVENOUS
  Filled 2015-05-06: qty 2

## 2015-05-06 MED ORDER — TETANUS-DIPHTH-ACELL PERTUSSIS 5-2.5-18.5 LF-MCG/0.5 IM SUSP: 0.5000 mL | Freq: Once | INTRAMUSCULAR | Status: DC

## 2015-05-06 MED ORDER — BENZOCAINE-MENTHOL 20-0.5 % EX AERO
1.0000 "application " | INHALATION_SPRAY | CUTANEOUS | Status: DC | PRN
  Administered 2015-05-07 – 2015-05-08 (×2): 1 via TOPICAL
  Filled 2015-05-06 (×2): qty 56

## 2015-05-06 MED ORDER — METHYLERGONOVINE MALEATE 0.2 MG PO TABS
0.2000 mg | ORAL_TABLET | ORAL | Status: DC | PRN
Start: 2015-05-06 — End: 2015-05-08

## 2015-05-06 MED ORDER — OXYTOCIN BOLUS FROM INFUSION
500.0000 mL | INTRAVENOUS | Status: DC
  Administered 2015-05-06: 500 mL via INTRAVENOUS

## 2015-05-06 MED ORDER — EPHEDRINE 5 MG/ML INJ
10.0000 mg | INTRAVENOUS | Status: DC | PRN
  Filled 2015-05-06: qty 2

## 2015-05-06 MED ORDER — ACETAMINOPHEN 325 MG PO TABS: 650.0000 mg | ORAL_TABLET | ORAL | Status: DC | PRN

## 2015-05-06 MED ORDER — LACTATED RINGERS IV SOLN: 500.0000 mL | Freq: Once | INTRAVENOUS | Status: DC

## 2015-05-06 MED ORDER — PRENATAL MULTIVITAMIN CH
1.0000 | ORAL_TABLET | Freq: Every day | ORAL | Status: DC
  Administered 2015-05-07: 1 via ORAL
  Filled 2015-05-06: qty 1

## 2015-05-06 MED ORDER — OXYCODONE-ACETAMINOPHEN 5-325 MG PO TABS: 1.0000 | ORAL_TABLET | ORAL | Status: DC | PRN

## 2015-05-06 MED ORDER — METHYLERGONOVINE MALEATE 0.2 MG/ML IJ SOLN: 0.2000 mg | INTRAMUSCULAR | Status: DC | PRN

## 2015-05-06 MED ORDER — BUTORPHANOL TARTRATE 1 MG/ML IJ SOLN
1.0000 mg | INTRAMUSCULAR | Status: DC | PRN
  Administered 2015-05-06: 1 mg via INTRAVENOUS
  Filled 2015-05-06: qty 1

## 2015-05-06 MED ORDER — LACTATED RINGERS IV BOLUS (SEPSIS)
500.0000 mL | Freq: Once | INTRAVENOUS | Status: AC
  Administered 2015-05-06: 1000 mL via INTRAVENOUS

## 2015-05-06 MED ORDER — LACTATED RINGERS IV SOLN
INTRAVENOUS | Status: DC
  Administered 2015-05-06 (×3): via INTRAVENOUS

## 2015-05-06 MED ORDER — MAGNESIUM HYDROXIDE 400 MG/5ML PO SUSP: 30.0000 mL | ORAL | Status: DC | PRN

## 2015-05-06 MED ORDER — OXYCODONE-ACETAMINOPHEN 5-325 MG PO TABS: 2.0000 | ORAL_TABLET | ORAL | Status: DC | PRN

## 2015-05-06 MED ORDER — IBUPROFEN 600 MG PO TABS
600.0000 mg | ORAL_TABLET | Freq: Four times a day (QID) | ORAL | Status: DC
  Administered 2015-05-06 – 2015-05-08 (×6): 600 mg via ORAL
  Filled 2015-05-06 (×6): qty 1

## 2015-05-06 MED ORDER — ONDANSETRON HCL 4 MG/2ML IJ SOLN
4.0000 mg | Freq: Four times a day (QID) | INTRAMUSCULAR | Status: DC | PRN
  Administered 2015-05-06: 4 mg via INTRAVENOUS
  Filled 2015-05-06: qty 2

## 2015-05-06 MED ORDER — TERBUTALINE SULFATE 1 MG/ML IJ SOLN
0.2500 mg | Freq: Once | INTRAMUSCULAR | Status: DC | PRN
  Filled 2015-05-06: qty 1

## 2015-05-06 MED ORDER — ONDANSETRON HCL 4 MG/2ML IJ SOLN
4.0000 mg | INTRAMUSCULAR | Status: DC | PRN
Start: 2015-05-06 — End: 2015-05-08

## 2015-05-06 MED ORDER — LACTATED RINGERS IV SOLN: 2.5000 [IU]/h | INTRAVENOUS | Status: DC

## 2015-05-06 MED ORDER — SENNOSIDES-DOCUSATE SODIUM 8.6-50 MG PO TABS
2.0000 | ORAL_TABLET | ORAL | Status: DC
  Administered 2015-05-06 – 2015-05-07 (×2): 2 via ORAL
  Filled 2015-05-06 (×2): qty 2

## 2015-05-06 MED ORDER — PENICILLIN G POTASSIUM 5000000 UNITS IJ SOLR
2.5000 10*6.[IU] | INTRAMUSCULAR | Status: DC
  Administered 2015-05-06 (×2): 2.5 10*6.[IU] via INTRAVENOUS
  Filled 2015-05-06 (×5): qty 2.5

## 2015-05-06 MED ORDER — SODIUM CHLORIDE 0.9 % IV SOLN
3.0000 g | Freq: Four times a day (QID) | INTRAVENOUS | Status: DC
  Administered 2015-05-06: 3 g via INTRAVENOUS
  Filled 2015-05-06 (×3): qty 3

## 2015-05-06 MED ORDER — PHENYLEPHRINE 40 MCG/ML (10ML) SYRINGE FOR IV PUSH (FOR BLOOD PRESSURE SUPPORT)
80.0000 ug | PREFILLED_SYRINGE | INTRAVENOUS | Status: DC | PRN
  Filled 2015-05-06: qty 2
  Filled 2015-05-06: qty 20

## 2015-05-06 MED ORDER — LANOLIN HYDROUS EX OINT: TOPICAL_OINTMENT | CUTANEOUS | Status: DC | PRN

## 2015-05-06 MED ORDER — LACTATED RINGERS IV SOLN
INTRAVENOUS | Status: DC
  Administered 2015-05-06: 18:00:00 via INTRAUTERINE

## 2015-05-06 MED ORDER — DIPHENHYDRAMINE HCL 50 MG/ML IJ SOLN: 12.5000 mg | INTRAMUSCULAR | Status: DC | PRN

## 2015-05-06 MED ORDER — SIMETHICONE 80 MG PO CHEW
80.0000 mg | CHEWABLE_TABLET | ORAL | Status: DC | PRN
  Administered 2015-05-07: 80 mg via ORAL
  Filled 2015-05-06: qty 1

## 2015-05-06 MED ORDER — MEASLES, MUMPS & RUBELLA VAC ~~LOC~~ INJ
0.5000 mL | INJECTION | Freq: Once | SUBCUTANEOUS | Status: DC
  Filled 2015-05-06: qty 0.5

## 2015-05-06 MED ORDER — SODIUM CHLORIDE 0.9 % IV SOLN
3.0000 g | Freq: Four times a day (QID) | INTRAVENOUS | Status: AC
  Administered 2015-05-06 – 2015-05-07 (×2): 3 g via INTRAVENOUS
  Filled 2015-05-06 (×2): qty 3

## 2015-05-06 MED ORDER — LIDOCAINE HCL (PF) 1 % IJ SOLN
30.0000 mL | INTRAMUSCULAR | Status: DC | PRN
  Administered 2015-05-06: 30 mL via SUBCUTANEOUS
  Filled 2015-05-06: qty 30

## 2015-05-06 MED ORDER — OXYCODONE HCL 5 MG PO TABS
5.0000 mg | ORAL_TABLET | ORAL | Status: DC | PRN
  Administered 2015-05-07 (×4): 5 mg via ORAL
  Filled 2015-05-06 (×5): qty 1

## 2015-05-06 MED ORDER — DIBUCAINE 1 % RE OINT: 1.0000 "application " | TOPICAL_OINTMENT | RECTAL | Status: DC | PRN

## 2015-05-06 MED ORDER — LIDOCAINE HCL (PF) 1 % IJ SOLN
INTRAMUSCULAR | Status: DC | PRN
  Administered 2015-05-06 (×2): 6 mL

## 2015-05-06 MED ORDER — OXYCODONE HCL 5 MG PO TABS: 10.0000 mg | ORAL_TABLET | ORAL | Status: DC | PRN

## 2015-05-06 MED ORDER — FLEET ENEMA 7-19 GM/118ML RE ENEM: 1.0000 | ENEMA | RECTAL | Status: DC | PRN

## 2015-05-06 MED ORDER — OXYTOCIN 10 UNIT/ML IJ SOLN
1.0000 m[IU]/min | INTRAVENOUS | Status: DC
  Administered 2015-05-06: 1 m[IU]/min via INTRAVENOUS
  Filled 2015-05-06: qty 4

## 2015-05-06 MED ORDER — LACTATED RINGERS IV SOLN
500.0000 mL | INTRAVENOUS | Status: DC | PRN
  Administered 2015-05-06: 500 mL via INTRAVENOUS

## 2015-05-06 NOTE — Consults (Signed)
  Anesthesia Pain Consult Note  Patient: Angela Brock, 30 y.o., female  Consult Requested by: Huel Cote, MD  Reason for Consult: OB CRNA rounds  Level of Consciousness: alert  Pain: none   Pain Goal: 3

## 2015-05-06 NOTE — Progress Notes (Signed)
Notified of pt arrival in MAU and positive fern. Will admit to labor and delivery

## 2015-05-06 NOTE — MAU Note (Signed)
Pt presents complaining of contractions every 3-4 minutes for 1 hour and possible leaking of fluid. Reports some spotting. Membranes stripped yesterday. Reports good fetal movement.

## 2015-05-06 NOTE — Progress Notes (Signed)
Feeling ctx and pressure Temp 101.4, BP 140-150/90 FHT- Cat II, some variable and early decels VE-C/C/0 to +1 Start pushing, on Unasyn for possible chorio

## 2015-05-06 NOTE — Anesthesia Preprocedure Evaluation (Addendum)
Anesthesia Evaluation  Patient identified by MRN, date of birth, ID band Patient awake    Reviewed: Allergy & Precautions, NPO status , Patient's Chart, lab work & pertinent test results  Airway Mallampati: II  TM Distance: >3 FB Neck ROM: Full    Dental no notable dental hx.    Pulmonary former smoker,    Pulmonary exam normal breath sounds clear to auscultation       Cardiovascular negative cardio ROS Normal cardiovascular exam Rhythm:Regular Rate:Normal     Neuro/Psych  Headaches, negative psych ROS   GI/Hepatic negative GI ROS, Neg liver ROS,   Endo/Other  Morbid obesity  Renal/GU negative Renal ROS  negative genitourinary   Musculoskeletal negative musculoskeletal ROS (+)   Abdominal (+) + obese,   Peds negative pediatric ROS (+)  Hematology negative hematology ROS (+)   Anesthesia Other Findings   Reproductive/Obstetrics negative OB ROS                            Anesthesia Physical Anesthesia Plan  ASA: III  Anesthesia Plan: Epidural   Post-op Pain Management:    Induction: Intravenous  Airway Management Planned: Natural Airway  Additional Equipment:   Intra-op Plan:   Post-operative Plan:   Informed Consent: I have reviewed the patients History and Physical, chart, labs and discussed the procedure including the risks, benefits and alternatives for the proposed anesthesia with the patient or authorized representative who has indicated his/her understanding and acceptance.   Dental advisory given  Plan Discussed with: CRNA  Anesthesia Plan Comments: (Informed consent obtained prior to proceeding including risk of failure, 1% risk of PDPH, risk of minor discomfort and bruising.  Discussed rare but serious complications including epidural abscess, permanent nerve injury, epidural hematoma.  Discussed alternatives to epidural analgesia and patient desires to proceed.   Timeout performed pre-procedure verifying patient name, procedure, and platelet count.  Patient tolerated procedure well. )       Anesthesia Quick Evaluation

## 2015-05-06 NOTE — Progress Notes (Signed)
Comfortable with epidural Afeb, VSS FHT- Cat II, intermittent variables, ctx q 3 min on pitocin VE-4/80/-2 per RN Continue pitocin augmentation, monitor progress, continue PCN for GBS

## 2015-05-06 NOTE — H&P (Addendum)
Angela Brock is a 30 y.o. female G2P1001 at 45 0/7 weeks (EDD 05/13/15 by LMP c/w 7 week [place])  presenting for SROM and onset of contractions that are subjectively painful.  Prenatal care is significant for a prior LTCS for FTP and Category 2 tracing, she would like a TOL and has signed a VBAC consent form.  She is also GBS positive.   Maternal Medical History:  Reason for admission: Rupture of membranes.   Contractions: Onset was 1-2 hours ago.   Frequency: regular.   Perceived severity is moderate.    Fetal activity: Perceived fetal activity is normal.    Prenatal Complications - Diabetes: none.    OB History    Gravida Para Term Preterm AB TAB SAB Ectopic Multiple Living   0 0 0 0 0 0 1    2013 LTCS 7#14oz  Past Medical History  Diagnosis Date  . Normal pregnancy, incidental 07/18/2011  . Headache(784.0)   . Hx of chlamydia infection     30 years old  . H/O menorrhagia   . Hx of dysmenorrhea   . BV (bacterial vaginosis)   . Abnormal Pap smear     age 34    Past Surgical History  Procedure Laterality Date  . Cesarean section  10/26/2011    Procedure: CESAREAN SECTION;  Surgeon: Michael Litter, MD;  Location: [facility];  Service: Gynecology;  Laterality: N/A;   Family History: family history includes Alcohol abuse in her maternal grandfather, maternal grandmother, and paternal grandfather; Depression in her mother; Hypertension in her mother; Migraines in her mother. There is no history of Anesthesia problems. Social History:  reports that she has quit smoking. Her smoking use included Cigarettes. She smoked 1.00 pack per day. She has never used smokeless tobacco. She reports that she uses illicit drugs (Marijuana) about twice per week. She reports that she does not drink alcohol.   Prenatal Transfer Tool  Maternal Diabetes: No Genetic Screening: Declined Maternal Ultrasounds/Referrals: Normal Fetal Ultrasounds or other Referrals:  None Maternal Substance Abuse:   No Significant Maternal Medications:  None Significant Maternal Lab Results:  Lab values include: Group B Strep positive Other Comments:  None  Review of Systems  Gastrointestinal: Positive for abdominal pain.    Dilation: 1 Effacement (%): Thick Station: Ballotable Exam by:: Sharen Hint RNC Blood pressure 138/73, pulse 102, last menstrual period 08/06/2014, unknown if currently breastfeeding. Maternal Exam:  Uterine Assessment: Contraction strength is moderate.  Contraction frequency is regular.   Abdomen: Patient reports no abdominal tenderness. Surgical scars: low transverse.   Fetal presentation: vertex  Introitus: Normal vulva. Normal vagina.    Physical Exam  Constitutional: She appears well-developed and well-nourished.  Cardiovascular: Normal rate and regular rhythm.   Respiratory: Effort normal and breath sounds normal.  GI: Soft.  Genitourinary: Vagina normal.  Neurological: She is alert.  Psychiatric: She has a normal mood and affect.    Prenatal labs: ABO, Rh: A/Positive/-- (08/10 0000) Antibody: Negative (08/10 0000) Rubella: Immune (08/10 0000) RPR: Nonreactive (08/10 0000)  HBsAg: Negative (08/10 0000)  HIV: Non-reactive (08/10 0000)  GBS: Positive (01/25 0000)  CF negative Hgb AA One hour GTT 87  Assessment/Plan: Pt admitted for TOL with prior LTCS.  Will receive PCN for +GBS.  Will augment with pitocin as needed.   Oliver Pila 05/06/2015, 6:14 AM

## 2015-05-06 NOTE — Progress Notes (Signed)
Feeling more ctx Afeb, VSS FHt- Cat II, min-mod variability, variable decels, ctx q 2-3 min VE-7/90/-1, vtx, IUPC inserted Will continue pitocin and monitor progress, continue PCN for +GBS.  Will do small amnioinfusion for variable decels

## 2015-05-06 NOTE — Anesthesia Procedure Notes (Signed)
Epidural Patient location during procedure: OB  Staffing Anesthesiologist: Sherrian Divers Performed by: anesthesiologist   Preanesthetic Checklist Completed: patient identified, site marked, surgical consent, pre-op evaluation, timeout performed, IV checked, risks and benefits discussed and monitors and equipment checked  Epidural Patient position: sitting Prep: DuraPrep Patient monitoring: heart rate and blood pressure Approach: midline Location: L3-L4 Injection technique: LOR saline  Needle:  Needle type: Tuohy  Needle gauge: 17 G Needle insertion depth: 6.5 cm Catheter type: closed end Catheter size: 19 Gauge  Assessment Events: blood not aspirated, injection not painful, no injection resistance, negative IV test and no paresthesia  Additional Notes Reason for block:procedure for pain

## 2015-05-07 ENCOUNTER — Encounter (HOSPITAL_COMMUNITY): Payer: Self-pay | Admitting: *Deleted

## 2015-05-07 LAB — CBC
HCT: 28.8 % — ABNORMAL LOW (ref 36.0–46.0)
Hemoglobin: 9.5 g/dL — ABNORMAL LOW (ref 12.0–15.0)
MCH: 26.2 pg (ref 26.0–34.0)
MCHC: 33 g/dL (ref 30.0–36.0)
MCV: 79.6 fL (ref 78.0–100.0)
PLATELETS: 317 10*3/uL (ref 150–400)
RBC: 3.62 MIL/uL — ABNORMAL LOW (ref 3.87–5.11)
RDW: 15.2 % (ref 11.5–15.5)
WBC: 25 10*3/uL — AB (ref 4.0–10.5)

## 2015-05-07 NOTE — Progress Notes (Signed)
PPD #1 No problems Afeb, VSS, just had one temp to 101.4 in labor Fundus firm, NT at U-1 CBC pending Continue routine postpartum care, ordered 2 doses of Unasyn after delivery, monitor temp
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
## 2015-05-07 NOTE — Lactation Note (Signed)
This note was copied from a baby's chart. Lactation Consultation Note Mom has a [age] yr old that she BF for 5 months w/bottle feeding. Mom states that is what she will do this time as well. Discussed supply and demand, cluster feeding I&O, and STS. Mom has large pendulum breast w/everted nipple at the bottom end of breast. Hand expression taught w/easy flow of transitional milk. Mom encouraged to feed baby 8-12 times/24 hours and with feeding cues. Mom encouraged to waken baby for feeds. Referred to Baby and Me Book in Breastfeeding section Pg. 22-23 for position options and Proper latch demonstration.Encouraged comfort during BF so colostrum flows better and mom will enjoy the feeding longer. Taking deep breaths and breast massage during BF. WH/LC brochure given w/resources, support groups and LC services.  Patient Name: Angela Brock ZOXWR'U Date: 05/07/2015 Reason for consult: Initial assessment   Maternal Data Has patient been taught Hand Expression?: Yes  Feeding Feeding Type: Breast Fed Length of feed: 10 min  LATCH Score/Interventions Latch: Repeated attempts needed to sustain latch, nipple held in mouth throughout feeding, stimulation needed to elicit sucking reflex. Intervention(s): Adjust position;Assist with latch;Breast massage;Breast compression  Audible Swallowing: A few with stimulation Intervention(s): Hand expression Intervention(s): Alternate breast massage  Type of Nipple: Everted at rest and after stimulation  Comfort (Breast/Nipple): Soft / non-tender     Hold (Positioning): Assistance needed to correctly position infant at breast and maintain latch. Intervention(s): Skin to skin;Position options;Support Pillows;Breastfeeding basics reviewed  LATCH Score: 7  Lactation Tools Discussed/Used Tools: Pump Breast pump type: Manual WIC Program: Yes Pump Review: Setup, frequency, and cleaning;Milk Storage Initiated by:: Peri Jefferson RN Date initiated::  05/07/15   Consult Status Consult Status: Follow-up Date: 05/08/15 Follow-up type: In-patient    Nael Petrosyan, Diamond Nickel 05/07/2015, 5:11 AM

## 2015-05-08 MED ORDER — BISACODYL 10 MG RE SUPP
10.0000 mg | Freq: Every day | RECTAL | Status: AC | PRN
  Administered 2015-05-08: 10 mg via RECTAL

## 2015-05-08 MED ORDER — IBUPROFEN 600 MG PO TABS: 600.0000 mg | ORAL_TABLET | Freq: Four times a day (QID) | ORAL | Status: DC

## 2015-05-08 MED ORDER — OXYCODONE HCL 5 MG PO TABS
5.0000 mg | ORAL_TABLET | ORAL | Status: DC | PRN
Start: 2015-05-08 — End: 2017-05-30

## 2015-05-08 MED ORDER — BISACODYL 10 MG RE SUPP: 10.0000 mg | Freq: Every day | RECTAL | Status: DC | PRN

## 2015-05-08 NOTE — Progress Notes (Signed)
Pt. Requesting something to help her have a bowel movement.  Called MD and received a verbal order for a suppository.  Suppository administered.  Will continue to monitor.

## 2015-05-08 NOTE — Lactation Note (Signed)
This note was copied from a baby's chart. Lactation Consultation Note Mom is breast and formula at home. Will be discharged home today. Mom requesting formula, states baby is cluster feeding and her breast needs a break. Mom denied having any questions or concerns. Reviewed LC out pt. Services. Mom is completed. Patient Name: Angela Brock ZOXWR'U Date: 05/08/2015 Reason for consult: Follow-up assessment   Maternal Data    Feeding Feeding Type: Formula Nipple Type: Slow - flow  LATCH Score/Interventions       Type of Nipple: Everted at rest and after stimulation  Comfort (Breast/Nipple): Filling, red/small blisters or bruises, mild/mod discomfort  Interventions (Mild/moderate discomfort): Hand massage;Hand expression  Hold (Positioning): No assistance needed to correctly position infant at breast.     Lactation Tools Discussed/Used Tools: Pump Breast pump type: Manual   Consult Status Consult Status: Complete Date: 05/08/15 Follow-up type: In-patient    Charyl Dancer 05/08/2015, 6:47 AM

## 2015-05-08 NOTE — Discharge Summary (Signed)
OB Discharge Summary     Patient Name: Angela Brock DOB: [birth year] MRN: [id number]  Date of admission: 05/06/2015 Delivering MD: Lavina Hamman   Date of discharge: 05/08/2015  Admitting diagnosis: 39 WEEKS CTX Intrauterine pregnancy: [redacted]w[redacted]d     Secondary diagnosis:  Active Problems:   ROM (rupture of membranes), premature   VBAC, delivered      Discharge diagnosis: VBAC and possible chorioamnionitis                                                                                                 Augmentation: Pitocin  Complications: Intrauterine Inflammation or infection (Chorioamniotis)  Hospital course:  Onset of Labor With Vaginal Delivery     30 y.o. yo [id number] at [redacted]w[redacted]d was admitted in Latent Labor on 05/06/2015. Patient had an uncomplicated labor course as follows:  Membrane Rupture Time/Date: 3:00 AM ,05/06/2015   Intrapartum Procedures: Episiotomy: None [1]                                         Lacerations:  2nd degree [3]  Patient had a delivery of a Viable infant. 05/06/2015  Information for the patient's newborn:  Angela, Brock [[id number]]  Delivery Method: Vag-Vacuum    Pateint had an uncomplicated postpartum course.  She received Unasyn for the last few hours of labor and then 2 doses postpartum, remained afebrile after delivery.  She is ambulating, tolerating a regular diet, passing flatus, and urinating well. Patient is discharged home in stable condition on 05/08/2015.    Physical exam  Filed Vitals:   05/07/15 0330 05/07/15 1300 05/07/15 1849 05/08/15 0511  BP: 106/65 123/70 139/86 117/79  Pulse: 91 98 100 88  Temp: 98.1 F (36.7 C) 98.6 F (37 C) 97.6 F (36.4 C) 97.5 F (36.4 C)  TempSrc: Oral Oral Oral Axillary  Resp: Height:      Weight:      SpO2: 100% 100%  100%   General: alert Lochia: appropriate Uterine Fundus: firm  Labs: Lab Results  Component Value Date   WBC 25.0* 05/07/2015   HGB 9.5* 05/07/2015   HCT  28.8* 05/07/2015   MCV 79.6 05/07/2015   PLT 317 05/07/2015   No flowsheet data found.  Discharge instruction: per After Visit Summary and "Baby and Me Booklet".  After visit meds:    Medication List    STOP taking these medications        DICLEGIS 10-10 MG Tbec  Generic drug:  Doxylamine-Pyridoxine     promethazine 25 MG suppository  Commonly known as:  PHENERGAN      TAKE these medications        acetaminophen 325 MG tablet  Commonly known as:  TYLENOL  Take 650 mg by mouth every 6 (six) hours as needed for mild pain or headache.     ibuprofen 600 MG tablet  Commonly known as:  ADVIL,MOTRIN  Take 1 tablet (600 mg total) by mouth every 6 (  six) hours.     oxyCODONE 5 MG immediate release tablet  Commonly known as:  Oxy IR/ROXICODONE  Take 1 tablet (5 mg total) by mouth every 4 (four) hours as needed for severe pain.     prenatal multivitamin Tabs tablet  Take 1 tablet by mouth daily at 12 noon.        Diet: routine diet  Activity: Advance as tolerated. Pelvic rest for 6 weeks.   Outpatient follow up:6 weeks   Newborn Data: Live born female  Birth Weight: 7 lb (3175 g) APGAR: 7, 9  Baby Feeding: Breast Disposition:home with mother   05/08/2015 Zenaida Niece, MD

## 2015-05-08 NOTE — Discharge Instructions (Signed)
As per discharge pamphlet °

## 2015-05-08 NOTE — Progress Notes (Signed)
PPD #2 Doing well, some pain with nursing Afeb, VSS Fundus firm, NT CBC    Component Value Date/Time   WBC 25.0* 05/07/2015 0737   RBC 3.62* 05/07/2015 0737   HGB 9.5* 05/07/2015 0737   HCT 28.8* 05/07/2015 0737   PLT 317 05/07/2015 0737   MCV 79.6 05/07/2015 0737   MCH 26.2 05/07/2015 0737   MCHC 33.0 05/07/2015 0737   RDW 15.2 05/07/2015 0737   LYMPHSABS 3.5 10/28/2011 0500   MONOABS 1.7* 10/28/2011 0500   EOSABS 0.2 10/28/2011 0500   BASOSABS 0.0 10/28/2011 0500    D/c home D/c home

## 2015-05-10 NOTE — Anesthesia Postprocedure Evaluation (Signed)
Anesthesia Post Note  Patient: Angela Brock  Procedure(s) Performed: * No procedures listed *  Anesthesia Type: Epidural Anesthetic complications: no    Last Vitals: There were no vitals filed for this visit.  Last Pain: There were no vitals filed for this visit.               Kennan Detter A.

## 2015-05-20 ENCOUNTER — Inpatient Hospital Stay (HOSPITAL_COMMUNITY)
Admission: RE | Admit: 2015-05-20 | Payer: Medicaid Other | Source: Ambulatory Visit | Admitting: Obstetrics and Gynecology

## 2015-05-20 ENCOUNTER — Encounter (HOSPITAL_COMMUNITY): Admission: RE | Payer: Self-pay | Source: Ambulatory Visit

## 2015-05-20 SURGERY — Surgical Case
Anesthesia: Regional

## 2016-10-27 LAB — OB RESULTS CONSOLE ABO/RH: RH Type: POSITIVE

## 2016-10-27 LAB — OB RESULTS CONSOLE HEPATITIS B SURFACE ANTIGEN: Hepatitis B Surface Ag: NEGATIVE

## 2016-10-27 LAB — OB RESULTS CONSOLE GC/CHLAMYDIA
CHLAMYDIA, DNA PROBE: NEGATIVE
GC PROBE AMP, GENITAL: NEGATIVE

## 2016-10-27 LAB — OB RESULTS CONSOLE HIV ANTIBODY (ROUTINE TESTING): HIV: NONREACTIVE

## 2016-10-27 LAB — OB RESULTS CONSOLE ANTIBODY SCREEN: Antibody Screen: NEGATIVE

## 2016-10-27 LAB — OB RESULTS CONSOLE RUBELLA ANTIBODY, IGM: RUBELLA: IMMUNE

## 2016-10-27 LAB — OB RESULTS CONSOLE RPR: RPR: NONREACTIVE

## 2017-03-14 NOTE — L&D Delivery Note (Signed)
Operative Delivery Note Pt progressed to complete and pushed fairly well.  She could bring the vertex to +3 but no further and became tired.  At 10:00 PM a viable female was delivered via Vaginal, Vacuum Investment banker, operational(Extractor).  Presentation: vertex; Position: Right,, Occiput,, Posterior; Station: +3.  Vertex spontaneously rotated to ROA as it descended.  Verbal consent: obtained from patient.  Risks and benefits discussed in detail.  Risks include, but are not limited to the risks of anesthesia, bleeding, infection, damage to maternal tissues, fetal cephalhematoma.  There is also the risk of inability to effect vaginal delivery of the head, or shoulder dystocia that cannot be resolved by established maneuvers, leading to the need for emergency cesarean section.  APGAR: 8, 9; weight pending.   Placenta status: spontaneous, intact.   Cord:  with the following complications: none.  Anesthesia:  Epidural Instruments: Kiwi Episiotomy:  None Lacerations: 1st degree Suture Repair: 3.0 vicryl rapide Est. Blood Loss (mL): 400  Mom to postpartum.  Baby to Couplet care / Skin to Skin.  Will give one more dose of Unasyn for probable chorioamnionitis, then d/c.  Will do circ in the office.    Leighton Roachodd D Michall Noffke 05/30/2017, 10:30 PM

## 2017-04-26 LAB — OB RESULTS CONSOLE GBS: GBS: POSITIVE

## 2017-05-18 ENCOUNTER — Encounter (HOSPITAL_COMMUNITY): Payer: Self-pay | Admitting: *Deleted

## 2017-05-18 ENCOUNTER — Telehealth (HOSPITAL_COMMUNITY): Payer: Self-pay | Admitting: *Deleted

## 2017-05-18 NOTE — Telephone Encounter (Signed)
Preadmission screen  

## 2017-05-30 ENCOUNTER — Inpatient Hospital Stay (HOSPITAL_COMMUNITY)
Admission: RE | Admit: 2017-05-30 | Discharge: 2017-06-01 | DRG: 805 | Disposition: A | Discharge status: Home / Self Care | Payer: Medicaid Other | Source: Ambulatory Visit | Attending: Obstetrics and Gynecology | Admitting: Obstetrics and Gynecology

## 2017-05-30 ENCOUNTER — Inpatient Hospital Stay (HOSPITAL_COMMUNITY): Admission: RE | Admit: 2017-05-30 | Payer: Self-pay | Source: Ambulatory Visit | Admitting: Obstetrics and Gynecology

## 2017-05-30 ENCOUNTER — Inpatient Hospital Stay (HOSPITAL_COMMUNITY): Payer: Medicaid Other | Admitting: Anesthesiology

## 2017-05-30 ENCOUNTER — Inpatient Hospital Stay (HOSPITAL_COMMUNITY): Payer: Self-pay

## 2017-05-30 ENCOUNTER — Encounter (HOSPITAL_COMMUNITY): Payer: Self-pay

## 2017-05-30 DIAGNOSIS — Z87891 Personal history of nicotine dependence: Secondary | ICD-10-CM | POA: Diagnosis not present

## 2017-05-30 DIAGNOSIS — O99824 Streptococcus B carrier state complicating childbirth: Secondary | ICD-10-CM | POA: Diagnosis present

## 2017-05-30 DIAGNOSIS — O34211 Maternal care for low transverse scar from previous cesarean delivery: Principal | ICD-10-CM | POA: Diagnosis present

## 2017-05-30 DIAGNOSIS — O411230 Chorioamnionitis, third trimester, not applicable or unspecified: Secondary | ICD-10-CM | POA: Diagnosis present

## 2017-05-30 DIAGNOSIS — Z3A40 40 weeks gestation of pregnancy: Secondary | ICD-10-CM

## 2017-05-30 LAB — CBC
HCT: 34.6 % — ABNORMAL LOW (ref 36.0–46.0)
HEMOGLOBIN: 11.7 g/dL — AB (ref 12.0–15.0)
MCH: 26.8 pg (ref 26.0–34.0)
MCHC: 33.8 g/dL (ref 30.0–36.0)
MCV: 79.4 fL (ref 78.0–100.0)
Platelets: 297 10*3/uL (ref 150–400)
RBC: 4.36 MIL/uL (ref 3.87–5.11)
RDW: 15.6 % — ABNORMAL HIGH (ref 11.5–15.5)
WBC: 7.8 10*3/uL (ref 4.0–10.5)

## 2017-05-30 LAB — TYPE AND SCREEN
ABO/RH(D): A POS
Antibody Screen: NEGATIVE

## 2017-05-30 LAB — RPR: RPR: NONREACTIVE

## 2017-05-30 MED ORDER — OXYCODONE-ACETAMINOPHEN 5-325 MG PO TABS: 1.0000 | ORAL_TABLET | ORAL | Status: DC | PRN

## 2017-05-30 MED ORDER — LACTATED RINGERS IV SOLN: 500.0000 mL | Freq: Once | INTRAVENOUS | Status: DC

## 2017-05-30 MED ORDER — ACETAMINOPHEN 325 MG PO TABS
650.0000 mg | ORAL_TABLET | ORAL | Status: DC | PRN
  Administered 2017-05-30: 650 mg via ORAL
  Filled 2017-05-30: qty 2

## 2017-05-30 MED ORDER — SODIUM CHLORIDE 0.9 % IV SOLN
3.0000 g | Freq: Four times a day (QID) | INTRAVENOUS | Status: DC
  Administered 2017-05-30: 3 g via INTRAVENOUS
  Filled 2017-05-30 (×4): qty 3

## 2017-05-30 MED ORDER — LIDOCAINE HCL (PF) 1 % IJ SOLN
30.0000 mL | INTRAMUSCULAR | Status: DC | PRN
  Filled 2017-05-30: qty 30

## 2017-05-30 MED ORDER — TERBUTALINE SULFATE 1 MG/ML IJ SOLN
0.2500 mg | Freq: Once | INTRAMUSCULAR | Status: DC | PRN
  Filled 2017-05-30: qty 1

## 2017-05-30 MED ORDER — BUTORPHANOL TARTRATE 1 MG/ML IJ SOLN: 1.0000 mg | INTRAMUSCULAR | Status: DC | PRN

## 2017-05-30 MED ORDER — EPHEDRINE 5 MG/ML INJ
10.0000 mg | INTRAVENOUS | Status: DC | PRN
  Filled 2017-05-30: qty 2

## 2017-05-30 MED ORDER — SODIUM CHLORIDE 0.9 % IV SOLN
5.0000 10*6.[IU] | Freq: Once | INTRAVENOUS | Status: AC
  Administered 2017-05-30: 5 10*6.[IU] via INTRAVENOUS
  Filled 2017-05-30: qty 5

## 2017-05-30 MED ORDER — PHENYLEPHRINE 40 MCG/ML (10ML) SYRINGE FOR IV PUSH (FOR BLOOD PRESSURE SUPPORT): 80.0000 ug | PREFILLED_SYRINGE | INTRAVENOUS | Status: DC | PRN

## 2017-05-30 MED ORDER — SOD CITRATE-CITRIC ACID 500-334 MG/5ML PO SOLN
30.0000 mL | ORAL | Status: DC | PRN
  Administered 2017-05-30: 30 mL via ORAL
  Filled 2017-05-30: qty 15

## 2017-05-30 MED ORDER — DIPHENHYDRAMINE HCL 50 MG/ML IJ SOLN: 12.5000 mg | INTRAMUSCULAR | Status: DC | PRN

## 2017-05-30 MED ORDER — ONDANSETRON HCL 4 MG/2ML IJ SOLN
4.0000 mg | Freq: Four times a day (QID) | INTRAMUSCULAR | Status: DC | PRN
  Administered 2017-05-30: 4 mg via INTRAVENOUS
  Filled 2017-05-30: qty 2

## 2017-05-30 MED ORDER — EPHEDRINE 5 MG/ML INJ: 10.0000 mg | INTRAVENOUS | Status: DC | PRN

## 2017-05-30 MED ORDER — OXYTOCIN BOLUS FROM INFUSION: 500.0000 mL | Freq: Once | INTRAVENOUS | Status: DC

## 2017-05-30 MED ORDER — OXYCODONE-ACETAMINOPHEN 5-325 MG PO TABS: 2.0000 | ORAL_TABLET | ORAL | Status: DC | PRN

## 2017-05-30 MED ORDER — LACTATED RINGERS IV SOLN: 500.0000 mL | INTRAVENOUS | Status: DC | PRN

## 2017-05-30 MED ORDER — FENTANYL 2.5 MCG/ML BUPIVACAINE 1/10 % EPIDURAL INFUSION (WH - ANES)
14.0000 mL/h | INTRAMUSCULAR | Status: DC | PRN
  Administered 2017-05-30 (×2): 14 mL/h via EPIDURAL
  Filled 2017-05-30 (×2): qty 100

## 2017-05-30 MED ORDER — OXYTOCIN 40 UNITS IN LACTATED RINGERS INFUSION - SIMPLE MED
1.0000 m[IU]/min | INTRAVENOUS | Status: DC
  Administered 2017-05-30: 2 m[IU]/min via INTRAVENOUS
  Filled 2017-05-30: qty 1000

## 2017-05-30 MED ORDER — LIDOCAINE HCL (PF) 1 % IJ SOLN
INTRAMUSCULAR | Status: DC | PRN
  Administered 2017-05-30 (×2): 5 mL via EPIDURAL

## 2017-05-30 MED ORDER — LACTATED RINGERS IV SOLN
INTRAVENOUS | Status: DC
  Administered 2017-05-30 (×3): via INTRAVENOUS

## 2017-05-30 MED ORDER — PENICILLIN G POT IN DEXTROSE 60000 UNIT/ML IV SOLN
3.0000 10*6.[IU] | INTRAVENOUS | Status: DC
  Administered 2017-05-30 (×2): 3 10*6.[IU] via INTRAVENOUS
  Filled 2017-05-30 (×4): qty 50

## 2017-05-30 MED ORDER — PHENYLEPHRINE 40 MCG/ML (10ML) SYRINGE FOR IV PUSH (FOR BLOOD PRESSURE SUPPORT)
80.0000 ug | PREFILLED_SYRINGE | INTRAVENOUS | Status: DC | PRN
Start: 2017-05-30 — End: 2017-05-31
  Filled 2017-05-30: qty 5

## 2017-05-30 MED ORDER — PHENYLEPHRINE 40 MCG/ML (10ML) SYRINGE FOR IV PUSH (FOR BLOOD PRESSURE SUPPORT)
80.0000 ug | PREFILLED_SYRINGE | INTRAVENOUS | Status: DC | PRN
  Filled 2017-05-30: qty 5

## 2017-05-30 MED ORDER — PHENYLEPHRINE 40 MCG/ML (10ML) SYRINGE FOR IV PUSH (FOR BLOOD PRESSURE SUPPORT)
80.0000 ug | PREFILLED_SYRINGE | INTRAVENOUS | Status: DC | PRN
  Filled 2017-05-30: qty 10
  Filled 2017-05-30: qty 5

## 2017-05-30 MED ORDER — LACTATED RINGERS IV SOLN
500.0000 mL | Freq: Once | INTRAVENOUS | Status: DC
Start: 2017-05-30 — End: 2017-05-30

## 2017-05-30 MED ORDER — OXYTOCIN 40 UNITS IN LACTATED RINGERS INFUSION - SIMPLE MED: 2.5000 [IU]/h | INTRAVENOUS | Status: DC

## 2017-05-30 NOTE — Anesthesia Pain Management Evaluation Note (Signed)
  CRNA Pain Management Visit Note  Patient: Angela Brock, 32 y.o., female  "Hello I am a member of the anesthesia team at [facility]. We have an anesthesia team available at all times to provide care throughout the hospital, including epidural management and anesthesia for C-section. I don't know your plan for the delivery whether it a natural birth, water birth, IV sedation, nitrous supplementation, doula or epidural, but we want to meet your pain goals."   1.Was your pain managed to your expectations on prior hospitalizations?   Yes   2.What is your expectation for pain management during this hospitalization?     Labor support without medications, Epidural and IV pain meds - she is open to anything.  3.How can we help you reach that goal? Be available if needed.  Record the patient's initial score and the patient's pain goal.   Pain: 0  Pain Goal: 5 [facility] wants you to be able to say your pain was always managed very well.  Sabastian Raimondi 05/30/2017

## 2017-05-30 NOTE — Progress Notes (Signed)
Comfortable with epidural Temp 102.9 now, VSS FHT- 150s, mod variability, + accels and scalp stim, some early vs late decels, had a run of late and variable decels that resolved with position change, Cat II, ctx q 2-3 min-adequate VE-7-8/80/-1, vtx Probable Triple I, will change PCN to Unasyn, continue pitocin and monitor progress and FHT

## 2017-05-30 NOTE — Anesthesia Procedure Notes (Signed)
Epidural Patient location during procedure: OB Start time: 05/30/2017 12:50 PM End time: 05/30/2017 12:58 PM  Staffing Anesthesiologist: Achille RichHodierne, Pristine Gladhill, MD Performed: anesthesiologist   Preanesthetic Checklist Completed: patient identified, site marked, pre-op evaluation, timeout performed, IV checked, risks and benefits discussed and monitors and equipment checked  Epidural Patient position: sitting Prep: DuraPrep Patient monitoring: heart rate, cardiac monitor, continuous pulse ox and blood pressure Approach: midline Location: L2-L3 Injection technique: LOR saline  Needle:  Needle type: Tuohy  Needle gauge: 17 G Needle length: 9 cm Needle insertion depth: 8 cm Catheter type: closed end flexible Catheter size: 19 Gauge Catheter at skin depth: 13 cm Test dose: negative and Other  Assessment Events: blood not aspirated, injection not painful, no injection resistance and negative IV test  Additional Notes Informed consent obtained prior to proceeding including risk of failure, 1% risk of PDPH, risk of minor discomfort and bruising.  Discussed rare but serious complications including epidural abscess, permanent nerve injury, epidural hematoma.  Discussed alternatives to epidural analgesia and patient desires to proceed.  Timeout performed pre-procedure verifying patient name, procedure, and platelet count.  Patient tolerated procedure well. Reason for block:procedure for pain

## 2017-05-30 NOTE — Progress Notes (Signed)
Comfortable with epidural, having some pressure Temp still 101-102, VSS FHT- 150s, mod variability, + accels, some late, variable and early decels, Cat II VE-Rim/C/0, vtx Will recheck at 2130 and see if rim has gone so she can push, continue Unasyn

## 2017-05-30 NOTE — Progress Notes (Signed)
Comfortable with epidural Afeb, VSS FHT- 130-140, mod variability, + accels and scalp stim, some early decels, a few variable decels, maybe one late decel, Cat II VE-6-7/80/-1, IUPC replaced Continue PCN and pitocin, monitor progress and FHT

## 2017-05-30 NOTE — H&P (Signed)
Angela Brock is a 32 y.o. female, G3 P2002, EGA 40+ weeks with EDC 3-18 presenting for elective induction.  Prenatal care complicated by h/o LTCS, successful VBAC last pregnancy and desires VBAC with this pregnancy also.  OB History    Gravida Para Term Preterm AB Living   3 2 2  0 0 2   SAB TAB Ectopic Multiple Live Births   0 0 0 0 2    LTCS for arrest of dilation Vacumm assisted VBAC  Past Medical History:  Diagnosis Date  . Abnormal Pap smear    age 32   . BV (bacterial vaginosis)   . Depression    after 2nd pregnancy  . H/O menorrhagia   . Headache(784.0)   . Hx of chlamydia infection    32 years old  . Hx of dysmenorrhea   . Normal pregnancy, incidental 07/18/2011   Past Surgical History:  Procedure Laterality Date  . CESAREAN SECTION  10/26/2011   Procedure: CESAREAN SECTION;  Surgeon: Michael LitterNaima A Dillard, MD;  Location: [facility];  Service: Gynecology;  Laterality: N/A;   Family History: family history includes Alcohol abuse in her maternal grandfather, maternal grandmother, and paternal grandfather; Depression in her mother; Hypertension in her mother; Migraines in her mother. Social History:  reports that she has quit smoking. Her smoking use included cigarettes. She smoked 1.00 pack per day. she has never used smokeless tobacco. She reports that she uses drugs. Drug: Marijuana. Frequency: 2.00 times per week. She reports that she does not drink alcohol.     Maternal Diabetes: No Genetic Screening: Declined Maternal Ultrasounds/Referrals: Normal Fetal Ultrasounds or other Referrals:  None Maternal Substance Abuse:  No Significant Maternal Medications:  None Significant Maternal Lab Results:  Lab values include: Group B Strep positive Other Comments:  None  Review of Systems  Respiratory: Negative.   Cardiovascular: Negative.    Maternal Medical History:  Fetal activity: Perceived fetal activity is normal.    Prenatal complications: no prenatal  complications Prenatal Complications - Diabetes: none.    Dilation: 2 Effacement (%): 50 Station: -2 Exam by:: Keano Guggenheim Blood pressure 132/75, pulse (!) 116, temperature 98.2 F (36.8 C), temperature source Oral, resp. rate 18, height 5\' 4"  (1.626 m), weight 123.8 kg (273 lb), last menstrual period 08/22/2016, unknown if currently breastfeeding. Maternal Exam:  Uterine Assessment: Contraction strength is mild.  Contraction frequency is irregular.   Abdomen: Patient reports no abdominal tenderness. Surgical scars: low transverse.   Estimated fetal weight is 8 lbs.   Fetal presentation: vertex  Introitus: Normal vulva. Normal vagina.  Amniotic fluid character: not assessed.  Pelvis: adequate for delivery.   Cervix: Cervix evaluated by digital exam.     Fetal Exam Fetal Monitor Review: Mode: ultrasound.   Baseline rate: 130.  Variability: moderate (6-25 bpm).   Pattern: accelerations present and no decelerations.    Fetal State Assessment: Category I - tracings are normal.     Physical Exam  Vitals reviewed. Constitutional: She appears well-developed and well-nourished.  Cardiovascular: Normal rate and regular rhythm.  Respiratory: Effort normal. No respiratory distress.  GI: Soft.    Prenatal labs: ABO, Rh: A/Positive/-- (08/16 0000) Antibody: Negative (08/16 0000) Rubella: Immune (08/16 0000) RPR: Nonreactive (08/16 0000)  HBsAg: Negative (08/16 0000)  HIV: Non-reactive (08/16 0000)  GBS: Positive (02/13 0000)   Assessment/Plan: IUP at 40+ weeks for induction, previous LTCS and VBAC, +GBS.  Will start pitocin induction, PCN for +GBS, monitor progress, anticipate VBAC.   Angela Brock 05/30/2017, 8:11 AM

## 2017-05-30 NOTE — Anesthesia Preprocedure Evaluation (Signed)
Anesthesia Evaluation  Patient identified by MRN, date of birth, ID band Patient awake    Reviewed: Allergy & Precautions, H&P , NPO status , Patient's Chart, lab work & pertinent test results  Airway Mallampati: II   Neck ROM: full    Dental   Pulmonary former smoker,    breath sounds clear to auscultation       Cardiovascular negative cardio ROS   Rhythm:regular Rate:Normal     Neuro/Psych  Headaches, PSYCHIATRIC DISORDERS Depression    GI/Hepatic   Endo/Other    Renal/GU      Musculoskeletal   Abdominal   Peds  Hematology   Anesthesia Other Findings   Reproductive/Obstetrics                             Anesthesia Physical Anesthesia Plan  ASA: II  Anesthesia Plan: Epidural   Post-op Pain Management:    Induction: Intravenous  PONV Risk Score and Plan: 2 and Treatment may vary due to age or medical condition  Airway Management Planned: Natural Airway  Additional Equipment:   Intra-op Plan:   Post-operative Plan:   Informed Consent: I have reviewed the patients History and Physical, chart, labs and discussed the procedure including the risks, benefits and alternatives for the proposed anesthesia with the patient or authorized representative who has indicated his/her understanding and acceptance.     Plan Discussed with: Anesthesiologist  Anesthesia Plan Comments:         Anesthesia Quick Evaluation

## 2017-05-30 NOTE — Progress Notes (Signed)
Feeling some ctx Afeb, VSS FHT- Cat I, 140s, ctx q 2-4 min when tracing VE-3/70/-2, vtx, AROM clear, IUPC and FSE Continue PCN for +GBS, continue pitocin and monitor progress

## 2017-05-30 NOTE — Progress Notes (Signed)
10 instruments 5 big sponges 5 small sponges 2 injectables 

## 2017-05-31 ENCOUNTER — Encounter (HOSPITAL_COMMUNITY): Payer: Self-pay

## 2017-05-31 LAB — CBC
HCT: 29.1 % — ABNORMAL LOW (ref 36.0–46.0)
Hemoglobin: 9.8 g/dL — ABNORMAL LOW (ref 12.0–15.0)
MCH: 26.8 pg (ref 26.0–34.0)
MCHC: 33.7 g/dL (ref 30.0–36.0)
MCV: 79.7 fL (ref 78.0–100.0)
Platelets: 266 K/uL (ref 150–400)
RBC: 3.65 MIL/uL — ABNORMAL LOW (ref 3.87–5.11)
RDW: 15.8 % — ABNORMAL HIGH (ref 11.5–15.5)
WBC: 15.6 K/uL — ABNORMAL HIGH (ref 4.0–10.5)

## 2017-05-31 MED ORDER — MEASLES, MUMPS & RUBELLA VAC ~~LOC~~ INJ
0.5000 mL | INJECTION | Freq: Once | SUBCUTANEOUS | Status: DC
  Filled 2017-05-31: qty 0.5

## 2017-05-31 MED ORDER — SIMETHICONE 80 MG PO CHEW: 80.0000 mg | CHEWABLE_TABLET | ORAL | Status: DC | PRN

## 2017-05-31 MED ORDER — ZOLPIDEM TARTRATE 5 MG PO TABS: 5.0000 mg | ORAL_TABLET | Freq: Every evening | ORAL | Status: DC | PRN

## 2017-05-31 MED ORDER — DIBUCAINE 1 % RE OINT: 1.0000 "application " | TOPICAL_OINTMENT | RECTAL | Status: DC | PRN

## 2017-05-31 MED ORDER — SENNOSIDES-DOCUSATE SODIUM 8.6-50 MG PO TABS
2.0000 | ORAL_TABLET | ORAL | Status: DC
  Administered 2017-05-31: 2 via ORAL
  Filled 2017-05-31: qty 2

## 2017-05-31 MED ORDER — OXYCODONE HCL 5 MG PO TABS: 10.0000 mg | ORAL_TABLET | ORAL | Status: DC | PRN

## 2017-05-31 MED ORDER — COCONUT OIL OIL
1.0000 "application " | TOPICAL_OIL | Status: DC | PRN
  Administered 2017-05-31: 1 via TOPICAL
  Filled 2017-05-31: qty 120

## 2017-05-31 MED ORDER — BENZOCAINE-MENTHOL 20-0.5 % EX AERO
1.0000 "application " | INHALATION_SPRAY | CUTANEOUS | Status: DC | PRN
  Administered 2017-05-31: 1 via TOPICAL
  Filled 2017-05-31: qty 56

## 2017-05-31 MED ORDER — ONDANSETRON HCL 4 MG/2ML IJ SOLN: 4.0000 mg | INTRAMUSCULAR | Status: DC | PRN

## 2017-05-31 MED ORDER — MAGNESIUM HYDROXIDE 400 MG/5ML PO SUSP: 30.0000 mL | ORAL | Status: DC | PRN

## 2017-05-31 MED ORDER — PRENATAL MULTIVITAMIN CH
1.0000 | ORAL_TABLET | Freq: Every day | ORAL | Status: DC
  Administered 2017-05-31 – 2017-06-01 (×2): 1 via ORAL
  Filled 2017-05-31 (×2): qty 1

## 2017-05-31 MED ORDER — METHYLERGONOVINE MALEATE 0.2 MG/ML IJ SOLN: 0.2000 mg | INTRAMUSCULAR | Status: DC | PRN

## 2017-05-31 MED ORDER — METHYLERGONOVINE MALEATE 0.2 MG PO TABS: 0.2000 mg | ORAL_TABLET | ORAL | Status: DC | PRN

## 2017-05-31 MED ORDER — ACETAMINOPHEN 325 MG PO TABS
650.0000 mg | ORAL_TABLET | ORAL | Status: DC | PRN
  Administered 2017-05-31 (×2): 650 mg via ORAL
  Filled 2017-05-31 (×2): qty 2

## 2017-05-31 MED ORDER — DIPHENHYDRAMINE HCL 25 MG PO CAPS: 25.0000 mg | ORAL_CAPSULE | Freq: Four times a day (QID) | ORAL | Status: DC | PRN

## 2017-05-31 MED ORDER — WITCH HAZEL-GLYCERIN EX PADS: 1.0000 "application " | MEDICATED_PAD | CUTANEOUS | Status: DC | PRN

## 2017-05-31 MED ORDER — IBUPROFEN 600 MG PO TABS
600.0000 mg | ORAL_TABLET | Freq: Four times a day (QID) | ORAL | Status: DC
  Administered 2017-05-31 – 2017-06-01 (×6): 600 mg via ORAL
  Filled 2017-05-31 (×6): qty 1

## 2017-05-31 MED ORDER — ONDANSETRON HCL 4 MG PO TABS: 4.0000 mg | ORAL_TABLET | ORAL | Status: DC | PRN

## 2017-05-31 MED ORDER — FAMOTIDINE 20 MG PO TABS
20.0000 mg | ORAL_TABLET | Freq: Every day | ORAL | Status: DC
  Administered 2017-05-31 – 2017-06-01 (×2): 20 mg via ORAL
  Filled 2017-05-31 (×2): qty 1

## 2017-05-31 MED ORDER — OXYCODONE HCL 5 MG PO TABS
5.0000 mg | ORAL_TABLET | ORAL | Status: DC | PRN
  Administered 2017-05-31: 5 mg via ORAL
  Filled 2017-05-31: qty 1

## 2017-05-31 MED ORDER — SODIUM CHLORIDE 0.9 % IV SOLN
3.0000 g | Freq: Four times a day (QID) | INTRAVENOUS | Status: AC
  Administered 2017-05-31: 3 g via INTRAVENOUS
  Filled 2017-05-31: qty 3

## 2017-05-31 MED ORDER — TETANUS-DIPHTH-ACELL PERTUSSIS 5-2.5-18.5 LF-MCG/0.5 IM SUSP: 0.5000 mL | Freq: Once | INTRAMUSCULAR | Status: DC

## 2017-05-31 NOTE — Plan of Care (Signed)
Progressing appropriately. Encouraged to call for assistance as needed.  

## 2017-05-31 NOTE — Anesthesia Postprocedure Evaluation (Signed)
Anesthesia Post Note  Patient: Angela PiqueLetisha N Brock  Procedure(s) Performed: AN AD HOC LABOR EPIDURAL     Patient location during evaluation: Mother Baby Anesthesia Type: Epidural Level of consciousness: awake Pain management: pain level controlled Vital Signs Assessment: post-procedure vital signs reviewed and stable Respiratory status: spontaneous breathing Cardiovascular status: stable Postop Assessment: patient able to bend at knees and epidural receding Anesthetic complications: no    Last Vitals:  Vitals:   05/31/17 0540 05/31/17 0830  BP: 125/71 122/78  Pulse: 91 96  Resp: 16 18  Temp: 36.6 C (!) 36.4 C  SpO2:  100%    Last Pain:  Vitals:   05/31/17 0830  TempSrc: Oral  PainSc:    Pain Goal:                 Edison PaceWILKERSON,Toniette Devera

## 2017-05-31 NOTE — Progress Notes (Signed)
Post Partum Day 1 Subjective: no complaints, up ad lib, voiding, tolerating PO, + flatus and bonding well with baby. Doing well  Objective: Blood pressure 122/78, pulse 96, temperature (!) 97.5 F (36.4 C), temperature source Oral, resp. rate 18, height 5\' 4"  (1.626 m), weight 273 lb (123.8 kg), last menstrual period 08/22/2016, SpO2 100 %, unknown if currently breastfeeding.  Physical Exam:  General: alert, cooperative and no distress Lochia: appropriate Uterine Fundus: firm Incision: n/a DVT Evaluation: No evidence of DVT seen on physical exam.  Recent Labs    05/30/17 0718 05/31/17 0534  HGB 11.7* 9.8*  HCT 34.6* 29.1*    Assessment/Plan: Plan for discharge tomorrow  Routine pp care   LOS: 1 day   Mirinda Monte W Bryndon Cumbie 05/31/2017, 9:03 AM

## 2017-05-31 NOTE — Lactation Note (Signed)
This note was copied from a baby's chart. Lactation Consultation Note Baby 8 hrs old. Mom stated BF was hurting. Mom is experienced BF. Her 1st child now 995 yrs old BF for 5 months, her 2nd child now 31 yrs old Bf for 1 1/4187yrs. No difficulty. Assessed mom's breast. Has everted compressible nipples. Discussed possible reasons for painful latches. Encouraged to get baby to open wide. Encouraged to call for assistance, STS, and no pacifiers. Mom had pacifier at bedside. Mom stated she is breast bottle. Encouraged BF preferred and first.  Encouraged to call for assistance if needed.  [facility] brochure given w/resources, support groups and LC services.  Patient Name: Angela Cheri GuppyLetisha Brock [id number]'XToday's Date: 05/31/2017 Reason for consult: Initial assessment   Maternal Data Has patient been taught Hand Expression?: Yes Does the patient have breastfeeding experience prior to this delivery?: Yes  Feeding    LATCH Score       Type of Nipple: Everted at rest and after stimulation  Comfort (Breast/Nipple): Soft / non-tender        Interventions Interventions: Breast feeding basics reviewed;Position options;Hand express;Breast compression  Lactation Tools Discussed/Used WIC Program: No   Consult Status Consult Status: Follow-up Date: 05/31/17 Follow-up type: In-patient    Charyl DancerCARVER, Kenitha Glendinning G 05/31/2017, 6:57 AM

## 2017-06-01 MED ORDER — OXYCODONE HCL 5 MG PO TABS: 5.0000 mg | ORAL_TABLET | ORAL | 0 refills | Status: DC | PRN

## 2017-06-01 MED ORDER — IBUPROFEN 600 MG PO TABS: 600.0000 mg | ORAL_TABLET | Freq: Four times a day (QID) | ORAL | 0 refills | Status: DC

## 2017-06-01 MED ORDER — PANTOPRAZOLE SODIUM 40 MG PO TBEC: 40.0000 mg | DELAYED_RELEASE_TABLET | Freq: Every day | ORAL | 1 refills | Status: DC

## 2017-06-01 MED ORDER — PANTOPRAZOLE SODIUM 40 MG PO TBEC
40.0000 mg | DELAYED_RELEASE_TABLET | Freq: Every day | ORAL | Status: DC
  Administered 2017-06-01: 40 mg via ORAL
  Filled 2017-06-01: qty 1

## 2017-06-01 MED ORDER — DOCUSATE SODIUM 100 MG PO CAPS: 100.0000 mg | ORAL_CAPSULE | Freq: Two times a day (BID) | ORAL | 0 refills | Status: DC

## 2017-06-01 NOTE — Progress Notes (Signed)
Post Partum Day 2 Subjective: no complaints, up ad lib and tolerating PO.  Sore from delivery, but pain controlled with meds.  Objective: Blood pressure 130/66, pulse 90, temperature 98 F (36.7 C), temperature source Oral, resp. rate 20, height 5\' 4"  (1.626 m), weight 273 lb (123.8 kg), last menstrual period 08/22/2016, SpO2 98 %, unknown if currently breastfeeding.  Physical Exam:  General: alert and cooperative Lochia: appropriate Uterine Fundus: firm   Recent Labs    05/30/17 0718 05/31/17 0534  HGB 11.7* 9.8*  HCT 34.6* 29.1*    Assessment/Plan: Discharge home Plans circ in office on Monday   LOS: 2 days   Oliver PilaKathy W Jillane Po 06/01/2017, 8:46 AM

## 2017-06-01 NOTE — Lactation Note (Signed)
This note was copied from a baby's chart. Lactation Consultation Note  Patient Name: Angela Cheri GuppyLetisha Gugel [id number]'UToday's Date: 06/01/2017   Baby 33 hours old.  Ex BF. Discussed pacifier use and mother states she understands. Mom encouraged to feed baby 8-12 times/24 hours and with feeding cues.  Reviewed engorgement care and monitoring voids/stools. Denies concerns or questions and has manual pump. Mother states she is primarily breastfeeding now.      Maternal Data    Feeding Feeding Type: Breast Fed Length of feed: 30 min  LATCH Score                   Interventions    Lactation Tools Discussed/Used     Consult Status      Hardie PulleyBerkelhammer, Kween Bacorn Boschen 06/01/2017, 7:16 AM

## 2017-06-01 NOTE — Progress Notes (Signed)
CSW received consult for hx of Anxiety and Depression.  CSW met with MOB and FOB/Kenyon Dockery to offer support and complete assessment.   Parents were pleasant and welcoming of CSW's visit.  CSW found them easy to engage.  FOB was attentive to baby, giving MOB opportunity to give attention to the conversation.  FOB was involved as well.  MOB reports that she is feeling well, but seemed appreciative of the space to discuss emotions during and after pregnancy.  She reports that she feels this time will be different since she and FOB have their own place and are more stable that she was after the births of her first two children.  She reports that she lived with her parents after the birth of her last child and found it difficult to feel comfortable raising a baby in someone else's space.  She reports she spoke with her doctor to address her emotional concerns.  MOB reports that she has taken the [place] Postnatal Depression Scale and that the score was a 6.  CSW explained this as her baseline on day of discharge and something to compare future scores to.  Parents report having all supplies and a great support system.   CSW provided education regarding the baby blues period vs. perinatal mood disorders, discussed treatment and gave resources for mental health follow up if concerns arise.  CSW recommends self-evaluation during the postpartum time period using the New Mom Checklist from Postpartum Progress, as well as the [place] Postnatal Depression Scale and encouraged parents to contact a medical professional if symptoms are noted at any time.  CSW informed parents that PMADs can affect fathers as well as mothers. CSW identifies no further need for intervention and no barriers to discharge at this time. Referral was also for marijuana use, however, upon chart review, CSW notes no documentation of marijuana use in MOB's chart since 2016.  CSW, therefore, did not discuss this with MOB during assessment.  CSW  now notes that baby's UDS was collected and was positive for THC.  CSW will make a report to [employer], though this was not discussed with parents.  CSW will monitor CDS results and update CPS worker accordingly.

## 2017-06-01 NOTE — Discharge Summary (Signed)
OB Discharge Summary     Patient Name: Angela Brock DOB: 10/25/1985 MRN: [id number]  Date of admission: 05/30/2017 Delivering MD: Jackelyn Knife, TODD   Date of discharge: 06/01/2017  Admitting diagnosis: INDUCTION Intrauterine pregnancy: [redacted]w[redacted]d     Secondary diagnosis:  Active Problems:   Indication for care in labor or delivery VBAC Vacuum assisted delivery  Additional problems: chorioamnionitis     Discharge diagnosis: Term Pregnancy Delivered                                                                                                Post partum procedures:antibiotics  Augmentation: AROM and Pitocin  Complications: None  Hospital course:  Induction of Labor With Vaginal Delivery   32 y.o. yo G3P3003 at [redacted]w[redacted]d was admitted to the hospital 05/30/2017 for induction of labor.  Indication for induction: Favorable cervix at term.  Patient had an uncomplicated labor course as follows: Membrane Rupture Time/Date: 12:00 PM ,05/30/2017   Intrapartum Procedures: Episiotomy: None [1]                                         Lacerations:  1st degree [2]  Patient had delivery of a Viable infant.  Information for the patient's newborn:  Leslie, Jester [[id number]]  Delivery Method: VBAC, Vacuum Assisted(Filed from Delivery Summary)   05/30/2017  Details of delivery can be found in separate delivery note.  Patient had a routine postpartum course. Patient is discharged home 06/01/17.  Physical exam  Vitals:   05/31/17 0540 05/31/17 0830 05/31/17 1825 06/01/17 0525  BP: 125/71 122/78 130/79 130/66  Pulse: 91 96 90 90  Resp: 16 18 20 20   Temp: 97.9 F (36.6 C) (!) 97.5 F (36.4 C) 98.2 F (36.8 C) 98 F (36.7 C)  TempSrc: Oral Oral Oral Oral  SpO2:  100% 100% 98%  Weight:      Height:       General: alert and cooperative Lochia: appropriate Uterine Fundus: firm  Labs: Lab Results  Component Value Date   WBC 15.6 (H) 05/31/2017   HGB 9.8 (L) 05/31/2017   HCT 29.1 (L)  05/31/2017   MCV 79.7 05/31/2017   PLT 266 05/31/2017   No flowsheet data found.  Discharge instruction: per After Visit Summary and "Baby and Me Booklet".  After visit meds:  Allergies as of 06/01/2017   No Known Allergies     Medication List    TAKE these medications   docusate sodium 100 MG capsule Commonly known as:  COLACE Take 1 capsule (100 mg total) by mouth 2 (two) times daily.   ibuprofen 600 MG tablet Commonly known as:  ADVIL,MOTRIN Take 1 tablet (600 mg total) by mouth every 6 (six) hours.   oxyCODONE 5 MG immediate release tablet Commonly known as:  Oxy IR/ROXICODONE Take 1 tablet (5 mg total) by mouth every 4 (four) hours as needed (pain scale 4-7).   pantoprazole 40 MG tablet Commonly known as:  PROTONIX Take 1 tablet (40 mg  total) by mouth daily.   prenatal multivitamin Tabs tablet Take 1 tablet by mouth daily at 12 noon.       Diet: routine diet  Activity: Advance as tolerated. Pelvic rest for 6 weeks.   Outpatient follow up:6 weeks Follow up Appt:No future appointments. Follow up Visit:No follow-ups on file.  Postpartum contraception: Progesterone only pills  Newborn Data: Live born female  Birth Weight: 9 lb 3.8 oz (4190 g) APGAR: 8, 9  Newborn Delivery   Birth date/time:  05/30/2017 22:00:00 Delivery type:  VBAC, Vacuum Assisted     Baby Feeding: Breast Disposition:home with mother  Plans circumcision in office and has scheduled  06/01/2017 Oliver PilaKathy W Jessyka Austria, MD

## 2018-03-14 NOTE — L&D Delivery Note (Signed)
Delivery Note She progressed to complete and pushed very well.  At 9:18 PM a viable female was delivered via Vaginal, Spontaneous (Presentation: vtx; ROA ).  APGAR: 9, 9; weight pending.   Placenta status: spontaneous, intact.  Cord:  with the following complications: none.   Anesthesia:  Epidural Episiotomy: None Lacerations: None Suture Repair: none Est. Blood Loss (mL):  250  Mom to postpartum.  Baby to Couplet care / Skin to Skin.  [name] Norwin Aleman 02/08/2019, 9:37 PM

## 2018-03-18 ENCOUNTER — Encounter (HOSPITAL_COMMUNITY): Payer: Self-pay | Admitting: Emergency Medicine

## 2018-03-18 ENCOUNTER — Emergency Department (HOSPITAL_COMMUNITY)
Admission: EM | Admit: 2018-03-18 | Discharge: 2018-03-18 | Disposition: A | Discharge status: Home / Self Care | Payer: Self-pay | Attending: Emergency Medicine | Admitting: Emergency Medicine

## 2018-03-18 ENCOUNTER — Other Ambulatory Visit: Payer: Self-pay

## 2018-03-18 ENCOUNTER — Emergency Department (HOSPITAL_COMMUNITY): Payer: Self-pay

## 2018-03-18 DIAGNOSIS — J181 Lobar pneumonia, unspecified organism: Secondary | ICD-10-CM | POA: Insufficient documentation

## 2018-03-18 DIAGNOSIS — R739 Hyperglycemia, unspecified: Secondary | ICD-10-CM | POA: Insufficient documentation

## 2018-03-18 DIAGNOSIS — Z87891 Personal history of nicotine dependence: Secondary | ICD-10-CM | POA: Insufficient documentation

## 2018-03-18 DIAGNOSIS — J189 Pneumonia, unspecified organism: Secondary | ICD-10-CM

## 2018-03-18 DIAGNOSIS — Z79899 Other long term (current) drug therapy: Secondary | ICD-10-CM | POA: Insufficient documentation

## 2018-03-18 DIAGNOSIS — E860 Dehydration: Secondary | ICD-10-CM | POA: Insufficient documentation

## 2018-03-18 LAB — I-STAT CHEM 8, ED
BUN: 9 mg/dL (ref 6–20)
CHLORIDE: 101 mmol/L (ref 98–111)
Calcium, Ion: 1.06 mmol/L — ABNORMAL LOW (ref 1.15–1.40)
Creatinine, Ser: 0.7 mg/dL (ref 0.44–1.00)
Glucose, Bld: 162 mg/dL — ABNORMAL HIGH (ref 70–99)
HEMATOCRIT: 41 % (ref 36.0–46.0)
HEMOGLOBIN: 13.9 g/dL (ref 12.0–15.0)
Potassium: 4.3 mmol/L (ref 3.5–5.1)
SODIUM: 137 mmol/L (ref 135–145)
TCO2: 29 mmol/L (ref 22–32)

## 2018-03-18 MED ORDER — AMOXICILLIN 500 MG PO CAPS: 500.0000 mg | ORAL_CAPSULE | Freq: Three times a day (TID) | ORAL | 0 refills | Status: DC

## 2018-03-18 MED ORDER — IBUPROFEN 400 MG PO TABS
600.0000 mg | ORAL_TABLET | Freq: Once | ORAL | Status: AC
  Administered 2018-03-18: 16:00:00 600 mg via ORAL
  Filled 2018-03-18: qty 1

## 2018-03-18 MED ORDER — ACETAMINOPHEN 500 MG PO TABS
1000.0000 mg | ORAL_TABLET | Freq: Once | ORAL | Status: AC
  Administered 2018-03-18: 1000 mg via ORAL
  Filled 2018-03-18: qty 2

## 2018-03-18 MED ORDER — AMOXICILLIN 500 MG PO CAPS
500.0000 mg | ORAL_CAPSULE | Freq: Once | ORAL | Status: AC
  Administered 2018-03-18: 500 mg via ORAL
  Filled 2018-03-18: qty 1

## 2018-03-18 MED ORDER — SODIUM CHLORIDE 0.9 % IV BOLUS
2000.0000 mL | Freq: Once | INTRAVENOUS | Status: AC
  Administered 2018-03-18: 2000 mL via INTRAVENOUS

## 2018-03-18 NOTE — ED Notes (Signed)
Patient transported to X-ray 

## 2018-03-18 NOTE — ED Notes (Signed)
Patient verbalizes understanding of discharge instructions. Opportunity for questioning and answers were provided. Armband removed by staff, pt discharged from ED in wheelchair.  

## 2018-03-18 NOTE — ED Notes (Signed)
ED Provider at bedside. 

## 2018-03-18 NOTE — Discharge Instructions (Addendum)
Lab work today shows that your blood sugar is mildly elevated at 162.  You may be borderline diabetic.Make sure that you drink at least six 8 ounce glasses of water each day in order to stay well-hydrated.  Take Tylenol or Advil as directed for aches or for temperature higher than 100.4.  Call the number on these instructions to get a primary care physician.  Ask your new primary care physician to order a test known as hemoglobin A1c to check you for diabetes. Return there if you have vomiting and are unable to hold down any fluids, or trouble breathing or if concern for any reason

## 2018-03-18 NOTE — ED Triage Notes (Signed)
Pt reports flu-like symptoms x 1 week, reports family has had same.

## 2018-03-18 NOTE — ED Notes (Signed)
Called for pt with no answer in waiting room

## 2018-03-18 NOTE — ED Provider Notes (Signed)
[facility] EMERGENCY DEPARTMENT Provider Note   CSN: [id number] Arrival date & time: 03/18/18  1344     History   Chief Complaint Chief Complaint  Patient presents with  . Influenza    HPI Angela Brock is a 33 y.o. female.  HPI Complains of cough productive of yellow sputum for approximately a week.  Other associated symptoms include diffuse myalgias.  She had 4 episodes of vomiting and one episode of diarrhea today, both nonbloody.   she denies abdominal pain.  Denies nausea at present.  She feels dehydrated and admits to lightheadedness on standing.  Several children in the home had similar illness but children have since gotten better.  She treated herself with DayQuil.  Last dose yesterday.  She complains of lightheadedness worse with standing and improved with lying down.  No other associated symptoms.  Last known normal menstrual period approximately a week ago.  No known fever Past Medical History:  Diagnosis Date  . Abnormal Pap smear    age 44   . BV (bacterial vaginosis)   . Depression    after 2nd pregnancy  . H/O menorrhagia   . Headache(784.0)   . Hx of chlamydia infection    33 years old  . Hx of dysmenorrhea   . Normal pregnancy, incidental 07/18/2011    Patient Active Problem List   Diagnosis Date Noted  . Indication for care in labor or delivery 05/30/2017  . ROM (rupture of membranes), premature 05/06/2015  . VBAC, delivered 05/06/2015  . Status post primary low transverse cesarean section 10/29/2011  . Hx of BV (bacterial vaginosis) 09/09/2011  . Hx of chlamydia infection 09/09/2011  . Hx of menorrhagia 09/09/2011  . Hx of dysmenorrhea 09/09/2011  . Hx of yeast infection 09/09/2011    Past Surgical History:  Procedure Laterality Date  . CESAREAN SECTION  10/26/2011   Procedure: CESAREAN SECTION;  Surgeon: Michael Litter, MD;  Location: [facility];  Service: Gynecology;  Laterality: N/A;     OB History    Gravida  3   Para    3   Term  3   Preterm  0   AB  0   Living  3     SAB  0   TAB  0   Ectopic  0   Multiple  0   Live Births  3            Home Medications    Prior to Admission medications   Medication Sig Start Date End Date Taking? Authorizing Provider  docusate sodium (COLACE) 100 MG capsule Take 1 capsule (100 mg total) by mouth 2 (two) times daily. 06/01/17   Huel Cote, MD  ibuprofen (ADVIL,MOTRIN) 600 MG tablet Take 1 tablet (600 mg total) by mouth every 6 (six) hours. 06/01/17   Huel Cote, MD  oxyCODONE (OXY IR/ROXICODONE) 5 MG immediate release tablet Take 1 tablet (5 mg total) by mouth every 4 (four) hours as needed (pain scale 4-7). 06/01/17   Huel Cote, MD  pantoprazole (PROTONIX) 40 MG tablet Take 1 tablet (40 mg total) by mouth daily. 06/01/17   Huel Cote, MD  Prenatal Vit-Fe Fumarate-FA (PRENATAL MULTIVITAMIN) TABS tablet Take 1 tablet by mouth daily at 12 noon.    [provider]    Family History Family History  Problem Relation Age of Onset  . Hypertension Mother   . Depression Mother   . Migraines Mother   . Alcohol abuse Maternal Grandmother   .  Alcohol abuse Maternal Grandfather   . Alcohol abuse Paternal Grandfather   . Anesthesia problems Neg Hx     Social History Social History   Tobacco Use  . Smoking status: Former Smoker    Packs/day: 1.00    Types: Cigarettes  . Smokeless tobacco: Never Used  Substance Use Topics  . Alcohol use: No    Alcohol/week: 2.0 standard drinks    Types: 2 Shots of liquor per week    Comment: not currently while pregnant   . Drug use: Yes    Frequency: 2.0 times per week    Types: Marijuana    Comment: quit June 2016     Allergies   Patient has no known allergies.   Review of Systems Review of Systems  Constitutional: Negative.   HENT: Negative.   Respiratory: Positive for cough.   Cardiovascular: Negative.   Gastrointestinal: Positive for diarrhea and vomiting.   Musculoskeletal: Positive for myalgias.  Skin: Negative.   Neurological: Positive for light-headedness.  Psychiatric/Behavioral: Negative.   All other systems reviewed and are negative.    Physical Exam Updated Vital Signs BP 127/89 (BP Location: Right Arm)   Pulse (!) 114   Temp (!) 101.9 F (38.8 C) (Oral)   Resp (!) 22   Ht 5\' 5"  (1.651 m)   Wt 104.3 kg   SpO2 97%   BMI 38.27 kg/m   Physical Exam Vitals signs and nursing note reviewed.  Constitutional:      Appearance: She is well-developed. She is obese.  HENT:     Head: Normocephalic and atraumatic.     Comments: Mucous membranes dry    Right Ear: Tympanic membrane normal.     Left Ear: Tympanic membrane normal.     Mouth/Throat:     Comments: mucus membranes dry Eyes:     Conjunctiva/sclera: Conjunctivae normal.     Pupils: Pupils are equal, round, and reactive to light.  Neck:     Musculoskeletal: Neck supple.     Thyroid: No thyromegaly.     Trachea: No tracheal deviation.  Cardiovascular:     Rate and Rhythm: Regular rhythm. Tachycardia present.     Heart sounds: No murmur.     Comments: Mildly tachycardic Pulmonary:     Effort: Pulmonary effort is normal.     Comments: Diffuse scant rhonchi.  No respiratory distress Abdominal:     General: Bowel sounds are normal. There is no distension.     Palpations: Abdomen is soft.     Tenderness: There is no abdominal tenderness.     Comments: Obese  Musculoskeletal: Normal range of motion.        General: No tenderness.  Skin:    General: Skin is warm and dry.     Findings: No rash.  Neurological:     Mental Status: She is alert and oriented to person, place, and time.     Coordination: Coordination normal.  Psychiatric:        Mood and Affect: Mood normal.      ED Treatments / Results  Labs (all labs ordered are listed, but only abnormal results are displayed) Labs Reviewed - No data to display  EKG None  Radiology No results  found.  Procedures Procedures (including critical care time)  Medications Ordered in ED Medications - No data to display  Chest x-ray viewed by me Results for orders placed or performed during the hospital encounter of 03/18/18  I-stat chem 8, ed  Result Value Ref Range  Sodium 137 135 - 145 mmol/L   Potassium 4.3 3.5 - 5.1 mmol/L   Chloride 101 98 - 111 mmol/L   BUN 9 6 - 20 mg/dL   Creatinine, Ser 4.540.70 0.44 - 1.00 mg/dL   Glucose, Bld 098162 (H) 70 - 99 mg/dL   Calcium, Ion 1.191.06 (L) 1.15 - 1.40 mmol/L   TCO2 29 22 - 32 mmol/L   Hemoglobin 13.9 12.0 - 15.0 g/dL   HCT 14.741.0 82.936.0 - 56.246.0 %   Dg Chest 2 View  Result Date: 03/18/2018 CLINICAL DATA:  Fever, chills, body aches and productive cough for 1 week. EXAM: CHEST - 2 VIEW COMPARISON:  PA and lateral chest 08/26/2008. FINDINGS: The patient has focal airspace disease in the right middle lobe consistent with pneumonia. Patchy airspace disease is also identified in the lingula. No pneumothorax or pleural effusion. Heart size is normal. No acute or focal bony abnormality. IMPRESSION: Airspace disease in the right middle lobe and lingula consistent with pneumonia. Electronically Signed   By: Drusilla Kannerhomas  Dalessio M.D.   On: 03/18/2018 14:58   Chest x-ray viewed by me Results for orders placed or performed during the hospital encounter of 03/18/18  I-stat chem 8, ed  Result Value Ref Range   Sodium 137 135 - 145 mmol/L   Potassium 4.3 3.5 - 5.1 mmol/L   Chloride 101 98 - 111 mmol/L   BUN 9 6 - 20 mg/dL   Creatinine, Ser 1.300.70 0.44 - 1.00 mg/dL   Glucose, Bld 865162 (H) 70 - 99 mg/dL   Calcium, Ion 7.841.06 (L) 1.15 - 1.40 mmol/L   TCO2 29 22 - 32 mmol/L   Hemoglobin 13.9 12.0 - 15.0 g/dL   HCT 69.641.0 29.536.0 - 28.446.0 %   Dg Chest 2 View  Result Date: 03/18/2018 CLINICAL DATA:  Fever, chills, body aches and productive cough for 1 week. EXAM: CHEST - 2 VIEW COMPARISON:  PA and lateral chest 08/26/2008. FINDINGS: The patient has focal airspace disease in  the right middle lobe consistent with pneumonia. Patchy airspace disease is also identified in the lingula. No pneumothorax or pleural effusion. Heart size is normal. No acute or focal bony abnormality. IMPRESSION: Airspace disease in the right middle lobe and lingula consistent with pneumonia. Electronically Signed   By: Drusilla Kannerhomas  Dalessio M.D.   On: 03/18/2018 14:58   Initial Impression / Assessment and Plan / ED Course  I have reviewed the triage vital signs and the nursing notes.  Pertinent labs & imaging results that were available during my care of the patient were reviewed by me and considered in my medical decision making (see chart for details). Lab work consistent with mild hyperglycemia    4:25 PM feels improved after intravenous hydration with normal saline, Tylenol, ibuprofen and amoxicillin.  Plan prescription amoxicillin.  Encourage oral hydration.  Antipyretics to include Tylenol and/or ibuprofen.  Referral primary care  Final Clinical Impressions(s) / ED Diagnoses   Final diagnoses:  None   #1 community-acquired pneumonia of the right middle lobe #2 hyperglycemia #3 mild dehydration  ED Discharge Orders    None       Doug SouJacubowitz, Aava Deland, MD 03/18/18 [phone]

## 2018-07-25 LAB — OB RESULTS CONSOLE RUBELLA ANTIBODY, IGM: Rubella: IMMUNE

## 2018-07-25 LAB — OB RESULTS CONSOLE HEPATITIS B SURFACE ANTIGEN: Hepatitis B Surface Ag: NEGATIVE

## 2018-11-21 LAB — OB RESULTS CONSOLE HIV ANTIBODY (ROUTINE TESTING): HIV: NONREACTIVE

## 2019-01-16 LAB — OB RESULTS CONSOLE GBS: GBS: POSITIVE

## 2019-02-07 ENCOUNTER — Other Ambulatory Visit: Payer: Self-pay | Admitting: Obstetrics and Gynecology

## 2019-02-07 ENCOUNTER — Other Ambulatory Visit: Payer: Self-pay | Admitting: Family Medicine

## 2019-02-08 ENCOUNTER — Inpatient Hospital Stay (HOSPITAL_COMMUNITY): Payer: Medicaid Other

## 2019-02-08 ENCOUNTER — Inpatient Hospital Stay (HOSPITAL_COMMUNITY): Payer: Medicaid Other | Admitting: Anesthesiology

## 2019-02-08 ENCOUNTER — Inpatient Hospital Stay (HOSPITAL_COMMUNITY)
Admission: AD | Admit: 2019-02-08 | Discharge: 2019-02-10 | DRG: 807 | Disposition: A | Discharge status: Home / Self Care | Payer: Medicaid Other | Attending: Obstetrics and Gynecology | Admitting: Obstetrics and Gynecology

## 2019-02-08 ENCOUNTER — Encounter (HOSPITAL_COMMUNITY): Payer: Self-pay

## 2019-02-08 ENCOUNTER — Other Ambulatory Visit: Payer: Self-pay

## 2019-02-08 DIAGNOSIS — Z87891 Personal history of nicotine dependence: Secondary | ICD-10-CM | POA: Diagnosis not present

## 2019-02-08 DIAGNOSIS — Z20828 Contact with and (suspected) exposure to other viral communicable diseases: Secondary | ICD-10-CM | POA: Diagnosis present

## 2019-02-08 DIAGNOSIS — O99214 Obesity complicating childbirth: Secondary | ICD-10-CM | POA: Diagnosis present

## 2019-02-08 DIAGNOSIS — O99824 Streptococcus B carrier state complicating childbirth: Secondary | ICD-10-CM | POA: Diagnosis present

## 2019-02-08 DIAGNOSIS — Z3A39 39 weeks gestation of pregnancy: Secondary | ICD-10-CM | POA: Diagnosis not present

## 2019-02-08 DIAGNOSIS — O34211 Maternal care for low transverse scar from previous cesarean delivery: Principal | ICD-10-CM | POA: Diagnosis present

## 2019-02-08 DIAGNOSIS — O26893 Other specified pregnancy related conditions, third trimester: Secondary | ICD-10-CM | POA: Diagnosis present

## 2019-02-08 LAB — CBC
HCT: 34.5 % — ABNORMAL LOW (ref 36.0–46.0)
Hemoglobin: 11.3 g/dL — ABNORMAL LOW (ref 12.0–15.0)
MCH: 27.2 pg (ref 26.0–34.0)
MCHC: 32.8 g/dL (ref 30.0–36.0)
MCV: 83.1 fL (ref 80.0–100.0)
Platelets: 327 10*3/uL (ref 150–400)
RBC: 4.15 MIL/uL (ref 3.87–5.11)
RDW: 14 % (ref 11.5–15.5)
WBC: 11.6 10*3/uL — ABNORMAL HIGH (ref 4.0–10.5)
nRBC: 0 % (ref 0.0–0.2)

## 2019-02-08 LAB — TYPE AND SCREEN
ABO/RH(D): A POS
Antibody Screen: NEGATIVE

## 2019-02-08 LAB — SARS CORONAVIRUS 2 BY RT PCR (HOSPITAL ORDER, PERFORMED IN ~~LOC~~ HOSPITAL LAB): SARS Coronavirus 2: NEGATIVE

## 2019-02-08 LAB — ABO/RH: ABO/RH(D): A POS

## 2019-02-08 LAB — RPR: RPR Ser Ql: NONREACTIVE

## 2019-02-08 MED ORDER — TERBUTALINE SULFATE 1 MG/ML IJ SOLN: 0.2500 mg | Freq: Once | INTRAMUSCULAR | Status: DC | PRN

## 2019-02-08 MED ORDER — LIDOCAINE HCL (PF) 1 % IJ SOLN
INTRAMUSCULAR | Status: DC | PRN
  Administered 2019-02-08: 3 mL via EPIDURAL
  Administered 2019-02-08: 5 mL via EPIDURAL
  Administered 2019-02-08: 2 mL via EPIDURAL

## 2019-02-08 MED ORDER — MAGNESIUM HYDROXIDE 400 MG/5ML PO SUSP: 30.0000 mL | ORAL | Status: DC | PRN

## 2019-02-08 MED ORDER — LACTATED RINGERS IV SOLN
INTRAVENOUS | Status: DC
  Administered 2019-02-08 (×2): via INTRAVENOUS

## 2019-02-08 MED ORDER — SENNOSIDES-DOCUSATE SODIUM 8.6-50 MG PO TABS
2.0000 | ORAL_TABLET | ORAL | Status: DC
  Administered 2019-02-09 – 2019-02-10 (×2): 2 via ORAL
  Filled 2019-02-08 (×3): qty 2

## 2019-02-08 MED ORDER — SOD CITRATE-CITRIC ACID 500-334 MG/5ML PO SOLN: 30.0000 mL | ORAL | Status: DC | PRN

## 2019-02-08 MED ORDER — MEASLES, MUMPS & RUBELLA VAC IJ SOLR: 0.5000 mL | Freq: Once | INTRAMUSCULAR | Status: DC

## 2019-02-08 MED ORDER — ONDANSETRON HCL 4 MG/2ML IJ SOLN: 4.0000 mg | Freq: Four times a day (QID) | INTRAMUSCULAR | Status: DC | PRN

## 2019-02-08 MED ORDER — LACTATED RINGERS IV SOLN: 500.0000 mL | INTRAVENOUS | Status: DC | PRN

## 2019-02-08 MED ORDER — OXYCODONE HCL 5 MG PO TABS: 5.0000 mg | ORAL_TABLET | ORAL | Status: DC | PRN

## 2019-02-08 MED ORDER — ONDANSETRON HCL 4 MG PO TABS: 4.0000 mg | ORAL_TABLET | ORAL | Status: DC | PRN

## 2019-02-08 MED ORDER — TETANUS-DIPHTH-ACELL PERTUSSIS 5-2.5-18.5 LF-MCG/0.5 IM SUSP: 0.5000 mL | Freq: Once | INTRAMUSCULAR | Status: DC

## 2019-02-08 MED ORDER — SIMETHICONE 80 MG PO CHEW
80.0000 mg | CHEWABLE_TABLET | ORAL | Status: DC | PRN
  Administered 2019-02-09: 80 mg via ORAL
  Filled 2019-02-08: qty 1

## 2019-02-08 MED ORDER — BUTORPHANOL TARTRATE 1 MG/ML IJ SOLN: 1.0000 mg | INTRAMUSCULAR | Status: DC | PRN

## 2019-02-08 MED ORDER — OXYTOCIN 40 UNITS IN NORMAL SALINE INFUSION - SIMPLE MED
1.0000 m[IU]/min | INTRAVENOUS | Status: DC
  Administered 2019-02-08: 2 m[IU]/min via INTRAVENOUS
  Filled 2019-02-08: qty 1000

## 2019-02-08 MED ORDER — IBUPROFEN 600 MG PO TABS
600.0000 mg | ORAL_TABLET | Freq: Four times a day (QID) | ORAL | Status: DC
  Administered 2019-02-09 – 2019-02-10 (×7): 600 mg via ORAL
  Filled 2019-02-08 (×7): qty 1

## 2019-02-08 MED ORDER — OXYTOCIN 40 UNITS IN NORMAL SALINE INFUSION - SIMPLE MED: 2.5000 [IU]/h | INTRAVENOUS | Status: DC

## 2019-02-08 MED ORDER — METHYLERGONOVINE MALEATE 0.2 MG PO TABS: 0.2000 mg | ORAL_TABLET | ORAL | Status: DC | PRN

## 2019-02-08 MED ORDER — PRENATAL MULTIVITAMIN CH
1.0000 | ORAL_TABLET | Freq: Every day | ORAL | Status: DC
  Administered 2019-02-09 – 2019-02-10 (×2): 1 via ORAL
  Filled 2019-02-08 (×2): qty 1

## 2019-02-08 MED ORDER — OXYCODONE-ACETAMINOPHEN 5-325 MG PO TABS: 1.0000 | ORAL_TABLET | ORAL | Status: DC | PRN

## 2019-02-08 MED ORDER — OXYTOCIN BOLUS FROM INFUSION
500.0000 mL | Freq: Once | INTRAVENOUS | Status: AC
  Administered 2019-02-08: 500 mL via INTRAVENOUS

## 2019-02-08 MED ORDER — METHYLERGONOVINE MALEATE 0.2 MG/ML IJ SOLN: 0.2000 mg | INTRAMUSCULAR | Status: DC | PRN

## 2019-02-08 MED ORDER — OXYCODONE HCL 5 MG PO TABS: 10.0000 mg | ORAL_TABLET | ORAL | Status: DC | PRN

## 2019-02-08 MED ORDER — SODIUM CHLORIDE (PF) 0.9 % IJ SOLN
INTRAMUSCULAR | Status: DC | PRN
  Administered 2019-02-08: 12 mL/h via EPIDURAL

## 2019-02-08 MED ORDER — PENICILLIN G POT IN DEXTROSE 60000 UNIT/ML IV SOLN
3.0000 10*6.[IU] | INTRAVENOUS | Status: DC
  Administered 2019-02-08 (×2): 3 10*6.[IU] via INTRAVENOUS
  Filled 2019-02-08 (×2): qty 50

## 2019-02-08 MED ORDER — BENZOCAINE-MENTHOL 20-0.5 % EX AERO
1.0000 "application " | INHALATION_SPRAY | CUTANEOUS | Status: DC | PRN
  Administered 2019-02-10: 1 via TOPICAL
  Filled 2019-02-08: qty 56

## 2019-02-08 MED ORDER — ACETAMINOPHEN 325 MG PO TABS
650.0000 mg | ORAL_TABLET | ORAL | Status: DC | PRN
  Administered 2019-02-09 – 2019-02-10 (×2): 650 mg via ORAL
  Filled 2019-02-08 (×2): qty 2

## 2019-02-08 MED ORDER — COCONUT OIL OIL: 1.0000 "application " | TOPICAL_OIL | Status: DC | PRN

## 2019-02-08 MED ORDER — DIBUCAINE (PERIANAL) 1 % EX OINT: 1.0000 "application " | TOPICAL_OINTMENT | CUTANEOUS | Status: DC | PRN

## 2019-02-08 MED ORDER — ZOLPIDEM TARTRATE 5 MG PO TABS: 5.0000 mg | ORAL_TABLET | Freq: Every evening | ORAL | Status: DC | PRN

## 2019-02-08 MED ORDER — WITCH HAZEL-GLYCERIN EX PADS: 1.0000 "application " | MEDICATED_PAD | CUTANEOUS | Status: DC | PRN

## 2019-02-08 MED ORDER — DIPHENHYDRAMINE HCL 25 MG PO CAPS: 25.0000 mg | ORAL_CAPSULE | Freq: Four times a day (QID) | ORAL | Status: DC | PRN

## 2019-02-08 MED ORDER — ACETAMINOPHEN 325 MG PO TABS: 650.0000 mg | ORAL_TABLET | ORAL | Status: DC | PRN

## 2019-02-08 MED ORDER — FENTANYL-BUPIVACAINE-NACL 0.5-0.125-0.9 MG/250ML-% EP SOLN
EPIDURAL | Status: AC
  Filled 2019-02-08: qty 250

## 2019-02-08 MED ORDER — SODIUM CHLORIDE 0.9 % IV SOLN
5.0000 10*6.[IU] | Freq: Once | INTRAVENOUS | Status: AC
  Administered 2019-02-08: 5 10*6.[IU] via INTRAVENOUS
  Filled 2019-02-08: qty 5

## 2019-02-08 MED ORDER — LIDOCAINE HCL (PF) 1 % IJ SOLN: 30.0000 mL | INTRAMUSCULAR | Status: DC | PRN

## 2019-02-08 MED ORDER — ONDANSETRON HCL 4 MG/2ML IJ SOLN: 4.0000 mg | INTRAMUSCULAR | Status: DC | PRN

## 2019-02-08 MED ORDER — OXYCODONE-ACETAMINOPHEN 5-325 MG PO TABS: 2.0000 | ORAL_TABLET | ORAL | Status: DC | PRN

## 2019-02-08 NOTE — Progress Notes (Signed)
Feeling some ctx Afeb, VSS FHT- 140s, Cat I, ctx q 2-3 min VE-3/50/-2, vtx, AROM clear Continue pitocin and PCN, monitor progress, anticipate VBAC

## 2019-02-08 NOTE — Anesthesia Procedure Notes (Signed)
Epidural Patient location during procedure: OB Start time: 02/08/2019 6:07 PM End time: 02/08/2019 6:21 PM  Staffing Anesthesiologist: Suzette Battiest, MD Performed: anesthesiologist   Preanesthetic Checklist Completed: patient identified, site marked, surgical consent, pre-op evaluation, timeout performed, IV checked, risks and benefits discussed and monitors and equipment checked  Epidural Patient position: sitting Prep: site prepped and draped and DuraPrep Patient monitoring: continuous pulse ox and blood pressure Approach: midline Location: L3-L4 Injection technique: LOR air  Needle:  Needle type: Tuohy  Needle gauge: 17 G Needle length: 9 cm and 9 Needle insertion depth: 7 cm Catheter type: closed end flexible Catheter size: 19 Gauge Catheter at skin depth: 14 (12-->14 when laid in lat decub position) cm Test dose: negative  Assessment Events: blood not aspirated, injection not painful, no injection resistance, negative IV test and no paresthesia

## 2019-02-08 NOTE — H&P (Signed)
Angela Brock is a 33 y.o. female, G4 P3003, EGA [redacted] weeks with EDC 12-2 presenting for elective induction.  Prenatal care complicated by previous LTCS, followed by VBAC x 2, for VBAC again.  OB History    Gravida  4   Para  3   Term  3   Preterm  0   AB  0   Living  3     SAB  0   TAB  0   Ectopic  0   Multiple  0   Live Births  3          Past Medical History:  Diagnosis Date  . Abnormal Pap smear    age 89   . BV (bacterial vaginosis)   . Depression    after 2nd pregnancy  . H/O menorrhagia   . Headache(784.0)   . Hx of chlamydia infection    33 years old  . Hx of dysmenorrhea   . Normal pregnancy, incidental 07/18/2011   Past Surgical History:  Procedure Laterality Date  . CESAREAN SECTION  10/26/2011   Procedure: CESAREAN SECTION;  Surgeon: Angela Coder, MD;  Location: [facility];  Service: Gynecology;  Laterality: N/A;   Family History: family history includes Alcohol abuse in her maternal grandfather, maternal grandmother, and paternal grandfather; Depression in her mother; Hypertension in her mother; Migraines in her mother. Social History:  reports that she has quit smoking. Her smoking use included cigarettes. She smoked 1.00 pack per day. She has never used smokeless tobacco. She reports current drug use. Frequency: 2.00 times per week. Drug: Marijuana. She reports that she does not drink alcohol.     Maternal Diabetes: No Genetic Screening: Declined Maternal Ultrasounds/Referrals: Normal Fetal Ultrasounds or other Referrals:  None Maternal Substance Abuse:  No Significant Maternal Medications:  None Significant Maternal Lab Results:  Group B Strep positive Other Comments:  None  Review of Systems  Respiratory: Negative.   Cardiovascular: Negative.    Maternal Medical History:  Contractions: Perceived severity is mild.    Fetal activity: Perceived fetal activity is normal.    Prenatal complications: no prenatal complications Prenatal  Complications - Diabetes: none.      Last menstrual period 03/12/2018, unknown if currently breastfeeding. Maternal Exam:  Uterine Assessment: Contraction strength is mild.  Contraction frequency is irregular.   Abdomen: Patient reports no abdominal tenderness. Surgical scars: low transverse.   Estimated fetal weight is 8 lbs.   Fetal presentation: vertex  Introitus: Normal vulva. Normal vagina.  Amniotic fluid character: not assessed.     Physical Exam  Constitutional: She appears well-developed and well-nourished.  Cardiovascular: Normal rate and regular rhythm.  Respiratory: Effort normal and breath sounds normal.  GI: Soft.  Genitourinary:    Vulva normal.     Prenatal labs: ABO, Rh:  A pos Antibody:  neg Rubella:  immune RPR:   NR HBsAg:   neg HIV:   NR GBS:  pos   Assessment/Plan: IUP at 39+ weeks, previous LTCS and VBAC x 2, GBS + for elective induction.  Will admit, start pitocin and PCN, monitor progress, anticipate VBAC   Angela Brock 02/08/2019, 8:59 AM

## 2019-02-08 NOTE — MAU Note (Signed)
Pt denied any Covid symptoms and tolerated swab.

## 2019-02-08 NOTE — Anesthesia Preprocedure Evaluation (Signed)
Anesthesia Evaluation  Patient identified by MRN, date of birth, ID band Patient awake    Reviewed: Allergy & Precautions, Patient's Chart, lab work & pertinent test results  Airway Mallampati: II  TM Distance: >3 FB     Dental   Pulmonary former smoker,    breath sounds clear to auscultation       Cardiovascular negative cardio ROS   Rhythm:Regular Rate:Normal     Neuro/Psych  Headaches,    GI/Hepatic negative GI ROS, Neg liver ROS,   Endo/Other  Morbid obesity  Renal/GU negative Renal ROS     Musculoskeletal   Abdominal   Peds  Hematology negative hematology ROS (+)   Anesthesia Other Findings   Reproductive/Obstetrics (+) Pregnancy                             Anesthesia Physical Anesthesia Plan  ASA: III  Anesthesia Plan: Epidural   Post-op Pain Management:    Induction:   PONV Risk Score and Plan: Treatment may vary due to age or medical condition  Airway Management Planned: Natural Airway  Additional Equipment:   Intra-op Plan:   Post-operative Plan:   Informed Consent: I have reviewed the patients History and Physical, chart, labs and discussed the procedure including the risks, benefits and alternatives for the proposed anesthesia with the patient or authorized representative who has indicated his/her understanding and acceptance.       Plan Discussed with:   Anesthesia Plan Comments:         Anesthesia Quick Evaluation

## 2019-02-09 NOTE — Progress Notes (Signed)
CSW received consult for MOB due to history of anxiety and depression. CSW will screen out consult due to MOB currently taking 20mg of Celexa daily to address her mental health.   Please consult CSW if needs arise or if MOB scores higher than 10 or answers yes on her Edinburgh Postpartum Depression Scale.  Angela Brock, MSW, LCSW-A Transitions of Care  Clinical Social Worker  [facility]  Medical ICU [phone]  

## 2019-02-09 NOTE — Anesthesia Postprocedure Evaluation (Signed)
Anesthesia Post Note  Patient: Angela Brock  Procedure(s) Performed: AN AD HOC LABOR EPIDURAL     Patient location during evaluation: Mother Baby Anesthesia Type: Epidural Level of consciousness: awake and alert Pain management: pain level controlled Vital Signs Assessment: post-procedure vital signs reviewed and stable Respiratory status: spontaneous breathing, nonlabored ventilation and respiratory function stable Cardiovascular status: stable Postop Assessment: no headache, no backache and epidural receding Anesthetic complications: no    Last Vitals:  Vitals:   02/09/19 0050 02/09/19 0401  BP: 132/74 121/87  Pulse: 98 77  Resp: 18 16  Temp: 36.8 C 37.2 C  SpO2:  100%    Last Pain:  Vitals:   02/09/19 0520  TempSrc:   PainSc: 0-No pain   Pain Goal:                   Rayvon Char

## 2019-02-09 NOTE — Lactation Note (Addendum)
This note was copied from a baby's chart. Lactation Consultation Note  Patient Name: Angela Brock [id number] Date: 02/09/2019 Reason for consult: Follow-up assessment;Term;Infant weight loss P4, 23 hour female infant, weight loss -2% Mom's hx: negative Hep C, VBAC and currently  on Celexa for anxiety which is L2-compatible with breastfeeding. LC entered room, infant has pacifier in mouth and grand mother was holding infant. LC discussed possible risk factors with pacifier usage that make cause disorganized suck and cue out hunger cues in young infants. When pacifier was removed infant was cuing to breastfeeding sucking on hands with mouthing movements. Mom plans to wait until infant is 3 weeks or one month before offering pacifier again to infant.  Per mom, she stated Nurse informed her that infant did not need to  breastfeed but every 3 hours, LC discussed with mom to breast feeding infant according to hunger cues and infant may be cluster feeding currently due to infant being almost 24 hours of age. Per mom, she doesn't have problem with feeding infant according to cues, she thought it didn't sound right to wait three  Hours or longer to  breastfeed infant.  Per mom, she feels breastfeeding is going well, infant is breastfeeding 25 to 30 minutes most feeding. Mom latched infant on right breast using football hold position, infant latched without difficulty swallows observed and infant was still breastfeeding after 10 minutes when LC left the room.  LC did not have to help mom with latch she is experienced at breastfeeding.  Mom knows to call Nurse or [facility] services if she has any questions, concerns or need assistance with latching infant to breast.  Maternal Data    Feeding    LATCH Score Latch: Grasps breast easily, tongue down, lips flanged, rhythmical sucking.  Audible Swallowing: Spontaneous and intermittent  Type of Nipple: Everted at rest and after stimulation  Comfort  (Breast/Nipple): Soft / non-tender  Hold (Positioning): No assistance needed to correctly position infant at breast.  LATCH Score: 10  Interventions Interventions: Skin to skin;Breast compression;Position options  Lactation Tools Discussed/Used     Consult Status Consult Status: Follow-up Date: 02/10/19 Follow-up type: In-patient    Angela Brock 02/09/2019, 8:22 PM

## 2019-02-09 NOTE — Lactation Note (Signed)
This note was copied from a baby's chart. Lactation Consultation Note  Patient Name: Angela Brock [id number] Date: 02/09/2019 Reason for consult: Initial assessment;Term P4, 4 hour female infant latched 3 times since delivery. Per mom, she breastfeed her 1st child - 8 months, 2nd child-14 months and 3rd child for 3 months. Mom plans to breastfeed infant longer than her 3rd child. Per mom, she feels breastfeeding is going well.  Mom is active on the [facility] program in [facility] and she has DEBP at home. LC entered room, mom was breastfeeding infant on right breast using cradle hold, infant was latch and swallows observed. LC was hands off , mom is experienced at breastfeeding and infant was still breastfeeding after 10 minutes when LC left the room. Mom knows to breastfeed according to hunger cues, 8 to 12 times within 24 hours and on demand/ Mom knows to call Nurse or [place] if she has any questions, concerns or need assistance with latching infant to breast.  Mom made aware of O/P services, breastfeeding support groups, community resources, and our phone # for post-discharge questions.    Maternal Data Formula Feeding for Exclusion: No Has patient been taught Hand Expression?: Yes Does the patient have breastfeeding experience prior to this delivery?: Yes  Feeding Feeding Type: Breast Fed  LATCH Score Latch: Grasps breast easily, tongue down, lips flanged, rhythmical sucking.  Audible Swallowing: Spontaneous and intermittent  Type of Nipple: Everted at rest and after stimulation  Comfort (Breast/Nipple): Soft / non-tender  Hold (Positioning): No assistance needed to correctly position infant at breast.  LATCH Score: 10  Interventions Interventions: Breast feeding basics reviewed;Breast compression;Assisted with latch;Adjust position;Skin to skin;Support pillows;Breast massage;Position options;Hand express;Expressed milk;DEBP;Coconut oil  Lactation Tools  Discussed/Used WIC Program: Yes   Consult Status Consult Status: Follow-up Date: 02/09/19 Follow-up type: In-patient    [name] 02/09/2019, 1:27 AM

## 2019-02-09 NOTE — Progress Notes (Signed)
Post Partum Day 1 Subjective: no complaints, up ad lib and tolerating PO  Baby nursing well  Objective: Blood pressure 128/86, pulse 91, temperature 99 F (37.2 C), temperature source Oral, resp. rate 18, height 5\' 4"  (1.626 m), weight 113.4 kg, last menstrual period 03/12/2018, SpO2 100 %, unknown if currently breastfeeding.  Physical Exam:  General: alert and cooperative Lochia: appropriate Uterine Fundus: firm   Recent Labs    02/08/19 0943  HGB 11.3*  HCT 34.5*    Assessment/Plan: Plan for discharge tomorrow   LOS: 1 day   Logan Bores 02/09/2019, 9:25 AM

## 2019-02-10 MED ORDER — IBUPROFEN 600 MG PO TABS: 600.0000 mg | ORAL_TABLET | Freq: Four times a day (QID) | ORAL | 0 refills | Status: DC

## 2019-02-10 MED ORDER — ACETAMINOPHEN 325 MG PO TABS: 650.0000 mg | ORAL_TABLET | ORAL | 1 refills | Status: DC | PRN

## 2019-02-10 NOTE — Discharge Summary (Signed)
OB Discharge Summary     Patient Name: Angela Brock DOB: 04/25/1985 MRN: [id number]  Date of admission: 02/08/2019 Delivering MD: Jackelyn Knife, TODD   Date of discharge: 02/10/2019  Admitting diagnosis: Preg Intrauterine pregnancy: [redacted]w[redacted]d     Secondary diagnosis:  Active Problems:   Indication for care in labor or delivery  Additional problems: none     Discharge diagnosis: Term Pregnancy Delivered and VBAC                                                                                                Post partum procedures:none  Augmentation: AROM and Pitocin  Complications: None  Hospital course:  Induction of Labor With Vaginal Delivery   33 y.o. yo [id number] at [redacted]w[redacted]d was admitted to the hospital 02/08/2019 for induction of labor.  Indication for induction: Favorable cervix at term.  Patient had an uncomplicated labor course as follows: Membrane Rupture Time/Date: 5:31 PM ,02/08/2019   Intrapartum Procedures: Episiotomy: None [1]                                         Lacerations:  None [1]  Patient had delivery of a Viable infant.  Information for the patient's newborn:  Amandalee, Lacap Girl Hennessy [[id number]]  Delivery Method: Vaginal, Spontaneous(Filed from Delivery Summary)    02/08/2019  Details of delivery can be found in separate delivery note.  Patient had a routine postpartum course. Patient is discharged home 02/10/19.  Physical exam  Vitals:   02/09/19 1242 02/09/19 1534 02/10/19 0030 02/10/19 0700  BP: 118/82 128/77 139/80 134/85  Pulse: 86 85 83 87  Resp: 18 16 20 20   Temp: 98.6 F (37 C) 98.6 F (37 C) 97.6 F (36.4 C) 98.6 F (37 C)  TempSrc: Oral Oral Oral Oral  SpO2: 100% 100% 100% 100%  Weight:      Height:       General: alert and cooperative Lochia: appropriate Uterine Fundus: firm  Labs: Lab Results  Component Value Date   WBC 11.6 (H) 02/08/2019   HGB 11.3 (L) 02/08/2019   HCT 34.5 (L) 02/08/2019   MCV 83.1 02/08/2019   PLT 327  02/08/2019   CMP Latest Ref Rng & Units 03/18/2018  Glucose 70 - 99 mg/dL 05/17/2018)  BUN 6 - 20 mg/dL 9  Creatinine 378(H - 8.85 mg/dL 0.27  Sodium 7.41 - 287 mmol/L 137  Potassium 3.5 - 5.1 mmol/L 4.3  Chloride 98 - 111 mmol/L 101    Discharge instruction: per After Visit Summary and "Baby and Me Booklet".  After visit meds:  Allergies as of 02/10/2019   No Known Allergies     Medication List    STOP taking these medications   amoxicillin 500 MG capsule Commonly known as: AMOXIL   docusate sodium 100 MG capsule Commonly known as: Colace   oxyCODONE 5 MG immediate release tablet Commonly known as: Oxy IR/ROXICODONE   pantoprazole 40 MG tablet Commonly known as: PROTONIX     TAKE these  medications   acetaminophen 325 MG tablet Commonly known as: Tylenol Take 2 tablets (650 mg total) by mouth every 4 (four) hours as needed (for pain scale < 4).   ibuprofen 600 MG tablet Commonly known as: ADVIL Take 1 tablet (600 mg total) by mouth every 6 (six) hours.   prenatal multivitamin Tabs tablet Take 1 tablet by mouth daily at 12 noon.       Diet: routine diet  Activity: Advance as tolerated. Pelvic rest for 6 weeks.   Outpatient follow up:6 weeks Follow up Appt:No future appointments. Follow up Visit:No follow-ups on file.  Postpartum contraception: Undecided but likely pills  Newborn Data: Live born female  Birth Weight: 6 lb 14.6 oz (3135 g) APGAR: 9, 9  Newborn Delivery   Birth date/time: 02/08/2019 21:18:00 Delivery type: Vaginal, Spontaneous      Baby Feeding: Breast Disposition:home with mother   02/10/2019 Logan Bores, MD

## 2019-02-10 NOTE — Progress Notes (Signed)
Post Partum Day 2 Subjective: no complaints, up ad lib and tolerating PO  Ready for d/c home  Objective: Blood pressure 134/85, pulse 87, temperature 98.6 F (37 C), temperature source Oral, resp. rate 20, height 5\' 4"  (1.626 m), weight 113.4 kg, last menstrual period 03/12/2018, SpO2 100 %, unknown if currently breastfeeding.  Physical Exam:  General: alert and cooperative Lochia: appropriate Uterine Fundus: firm  Recent Labs    02/08/19 0943  HGB 11.3*  HCT 34.5*    Assessment/Plan: Discharge home Baby to have extra digit possibly removed prior to d/c   LOS: 2 days   Logan Bores 02/10/2019, 9:43 AM

## 2019-02-10 NOTE — Lactation Note (Signed)
This note was copied from a baby's chart. Lactation Consultation Note  Patient Name: Girl Kristal Perl [id number] Date: 02/10/2019 Reason for consult: Follow-up assessment  P4 mother whose infant is now 57 hours old.  Mother breast fed all of her other children (now33, 27 and 32 years old).  Mother had no questions/concerns related to breast feeding.  She stated that baby prefers the left breast as compared to the right breast.  Encouraged to continue feeding on the more difficult breast; maybe even beginning the feed with the right breast.  Mother stated that baby fed a lot last night.  She will continue feeding on cue or at least 8-12 times/24 hours.  Suggested hand expression before/after feedings to help increase milk supply.  Mother is familiar with engorgement and did not wish to review.  Manual pump provided.  She has a DEBP for home use.  She also has our phone number for questions after discharge.   Maternal Data    Feeding Feeding Type: Breast Fed  LATCH Score                   Interventions    Lactation Tools Discussed/Used     Consult Status Consult Status: Complete Date: 02/10/19 Follow-up type: In-patient    Azriel Jakob R Myliah Medel 02/10/2019, 7:49 AM

## 2019-08-29 IMAGING — DX DG CHEST 2V
2 series · 2 of 2 positions shown · non-contrast
Comparison: PA and lateral chest 08/26/2008.

CLINICAL DATA: Fever, chills, body aches and productive cough for 1
week.

EXAM:
CHEST - 2 VIEW

[chest pa]
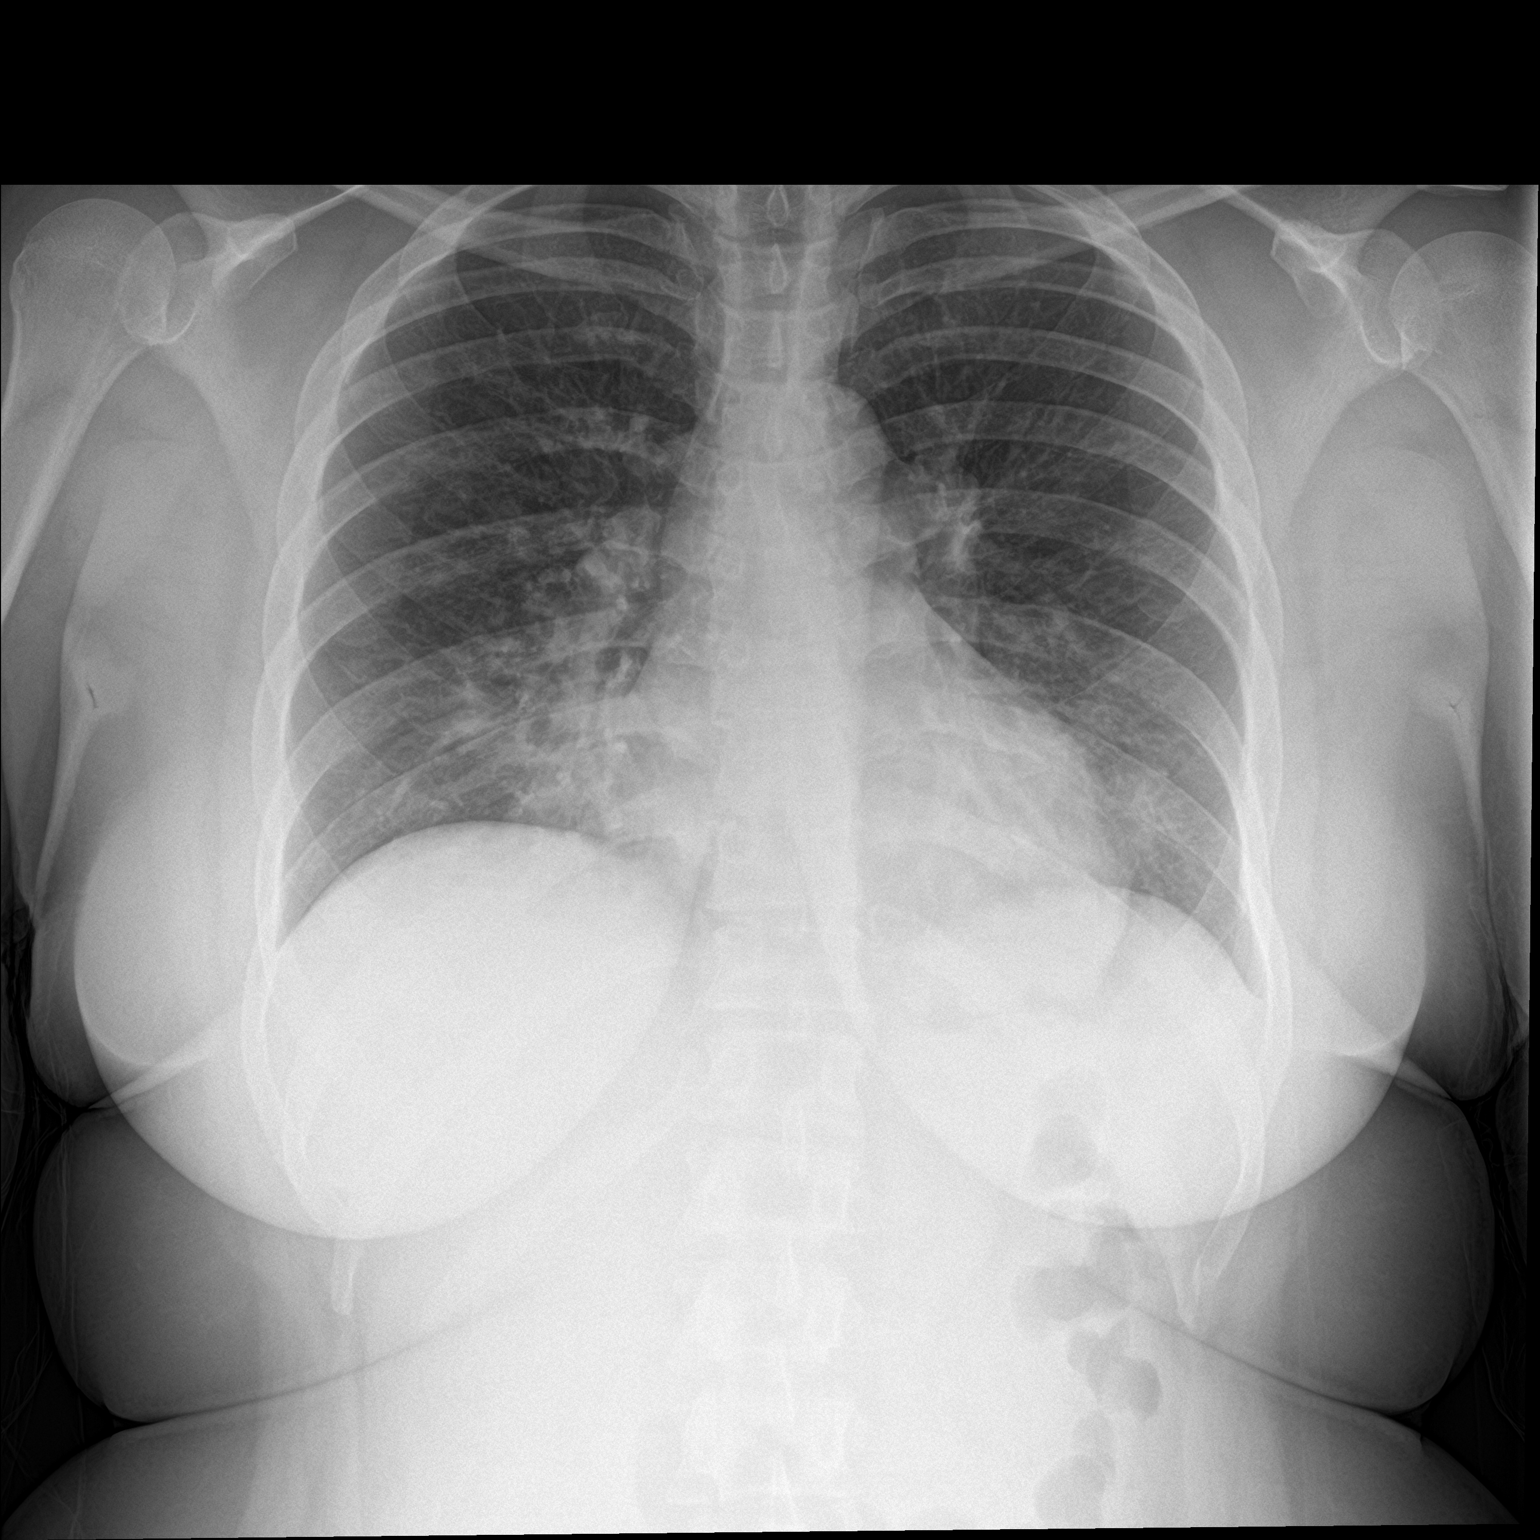

[chest lat]
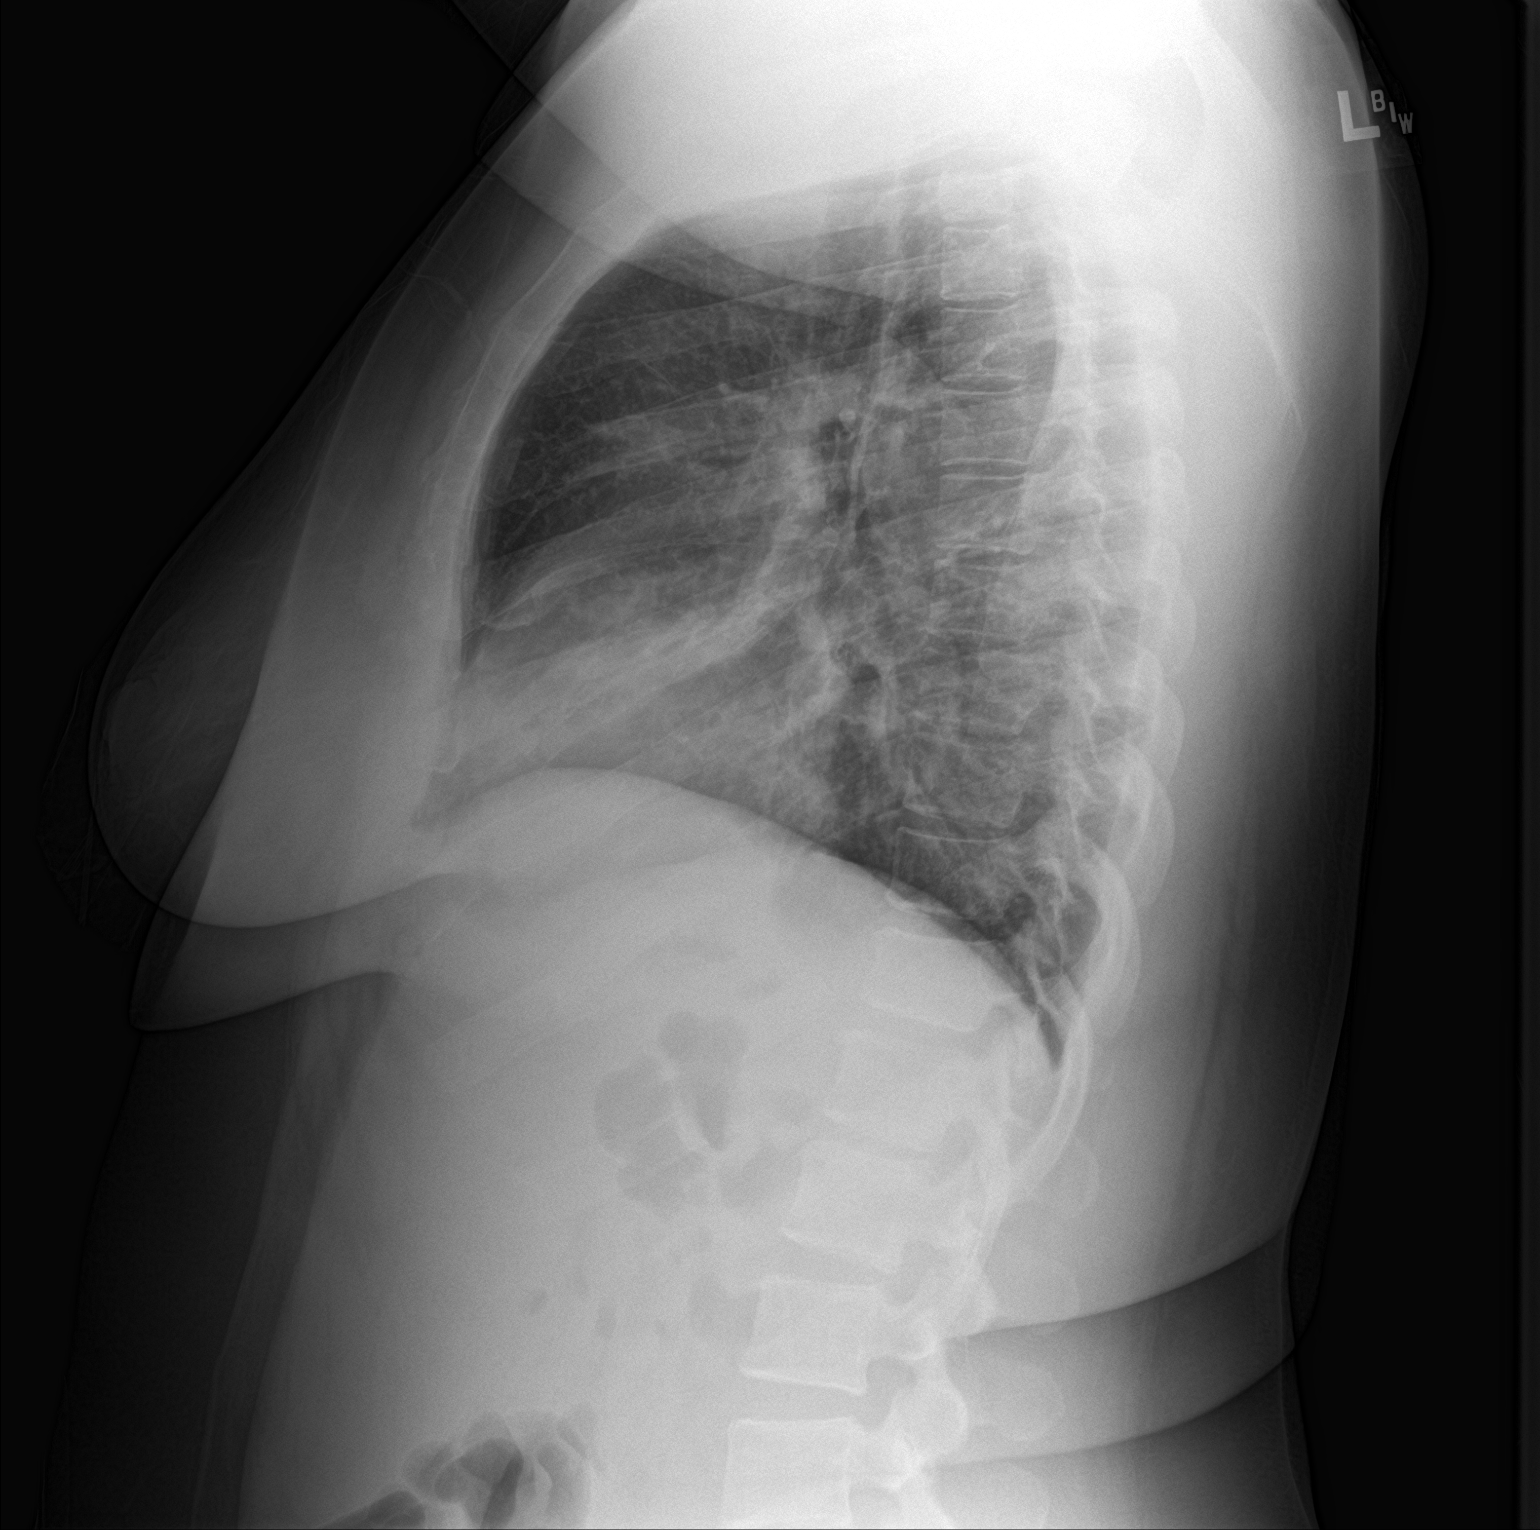

[2 of 2 positions shown; findings below may reference images not displayed]

FINDINGS: The patient has focal airspace disease in the right middle lobe
consistent with pneumonia. Patchy airspace disease is also
identified in the lingula. No pneumothorax or pleural effusion.
Heart size is normal. No acute or focal bony abnormality.
IMPRESSION: Airspace disease in the right middle lobe and lingula consistent
with pneumonia.

## 2021-05-01 ENCOUNTER — Emergency Department (HOSPITAL_COMMUNITY): Payer: Medicaid Other

## 2021-05-01 ENCOUNTER — Emergency Department (HOSPITAL_COMMUNITY)
Admission: EM | Admit: 2021-05-01 | Discharge: 2021-05-01 | Disposition: A | Discharge status: Home / Self Care | Payer: Medicaid Other | Attending: Emergency Medicine | Admitting: Emergency Medicine

## 2021-05-01 ENCOUNTER — Ambulatory Visit (HOSPITAL_COMMUNITY)
Admission: EM | Admit: 2021-05-01 | Discharge: 2021-05-01 | Discharge status: Against Medical Advice | Payer: Medicaid Other

## 2021-05-01 ENCOUNTER — Other Ambulatory Visit: Payer: Self-pay

## 2021-05-01 ENCOUNTER — Encounter (HOSPITAL_COMMUNITY): Payer: Self-pay | Admitting: Emergency Medicine

## 2021-05-01 DIAGNOSIS — Z20822 Contact with and (suspected) exposure to covid-19: Secondary | ICD-10-CM | POA: Diagnosis not present

## 2021-05-01 DIAGNOSIS — D72829 Elevated white blood cell count, unspecified: Secondary | ICD-10-CM | POA: Diagnosis not present

## 2021-05-01 DIAGNOSIS — J020 Streptococcal pharyngitis: Secondary | ICD-10-CM | POA: Diagnosis not present

## 2021-05-01 DIAGNOSIS — R000 Tachycardia, unspecified: Secondary | ICD-10-CM | POA: Diagnosis not present

## 2021-05-01 DIAGNOSIS — L539 Erythematous condition, unspecified: Secondary | ICD-10-CM | POA: Diagnosis not present

## 2021-05-01 DIAGNOSIS — J029 Acute pharyngitis, unspecified: Secondary | ICD-10-CM | POA: Diagnosis present

## 2021-05-01 LAB — RESP PANEL BY RT-PCR (FLU A&B, COVID) ARPGX2
Influenza A by PCR: NEGATIVE
Influenza B by PCR: NEGATIVE
SARS Coronavirus 2 by RT PCR: NEGATIVE

## 2021-05-01 LAB — BASIC METABOLIC PANEL
Anion gap: 6 (ref 5–15)
BUN: 7 mg/dL (ref 6–20)
CO2: 27 mmol/L (ref 22–32)
Calcium: 8.9 mg/dL (ref 8.9–10.3)
Chloride: 107 mmol/L (ref 98–111)
Creatinine, Ser: 0.65 mg/dL (ref 0.44–1.00)
GFR, Estimated: >60 mL/min (ref >60)
Glucose, Bld: 96 mg/dL (ref 70–99)
Potassium: 3.6 mmol/L (ref 3.5–5.1)
Sodium: 140 mmol/L (ref 135–145)

## 2021-05-01 LAB — CBC WITH DIFFERENTIAL/PLATELET
Abs Immature Granulocytes: 0.09 10*3/uL — ABNORMAL HIGH (ref 0.00–0.07)
Basophils Absolute: 0.1 10*3/uL (ref 0.0–0.1)
Basophils Relative: 0 %
Eosinophils Absolute: 0 10*3/uL (ref 0.0–0.5)
Eosinophils Relative: 0 %
HCT: 36.5 % (ref 36.0–46.0)
Hemoglobin: 11.7 g/dL — ABNORMAL LOW (ref 12.0–15.0)
Immature Granulocytes: 0 %
Lymphocytes Relative: 10 %
Lymphs Abs: 2.2 10*3/uL (ref 0.7–4.0)
MCH: 26.1 pg (ref 26.0–34.0)
MCHC: 32.1 g/dL (ref 30.0–36.0)
MCV: 81.3 fL (ref 80.0–100.0)
Monocytes Absolute: 1.7 10*3/uL — ABNORMAL HIGH (ref 0.1–1.0)
Monocytes Relative: 8 %
Neutro Abs: 16.8 10*3/uL — ABNORMAL HIGH (ref 1.7–7.7)
Neutrophils Relative %: 82 %
Platelets: 403 10*3/uL — ABNORMAL HIGH (ref 150–400)
RBC: 4.49 MIL/uL (ref 3.87–5.11)
RDW: 15.7 % — ABNORMAL HIGH (ref 11.5–15.5)
WBC: 20.9 10*3/uL — ABNORMAL HIGH (ref 4.0–10.5)
nRBC: 0 % (ref 0.0–0.2)

## 2021-05-01 LAB — I-STAT CHEM 8, ED
BUN: 7 mg/dL (ref 6–20)
Calcium, Ion: 1.25 mmol/L (ref 1.15–1.40)
Chloride: 103 mmol/L (ref 98–111)
Creatinine, Ser: 0.6 mg/dL (ref 0.44–1.00)
Glucose, Bld: 90 mg/dL (ref 70–99)
HCT: 39 % (ref 36.0–46.0)
Hemoglobin: 13.3 g/dL (ref 12.0–15.0)
Potassium: 3.5 mmol/L (ref 3.5–5.1)
Sodium: 142 mmol/L (ref 135–145)
TCO2: 27 mmol/L (ref 22–32)

## 2021-05-01 LAB — GROUP A STREP BY PCR: Group A Strep by PCR: DETECTED — AB

## 2021-05-01 MED ORDER — IOHEXOL 300 MG/ML  SOLN
100.0000 mL | Freq: Once | INTRAMUSCULAR | Status: AC | PRN
  Administered 2021-05-01: 100 mL via INTRAVENOUS

## 2021-05-01 MED ORDER — AMOXICILLIN-POT CLAVULANATE 875-125 MG PO TABS
1.0000 | ORAL_TABLET | Freq: Once | ORAL | Status: AC
  Administered 2021-05-01: 1 via ORAL
  Filled 2021-05-01: qty 1

## 2021-05-01 MED ORDER — MORPHINE SULFATE (PF) 4 MG/ML IV SOLN
4.0000 mg | Freq: Once | INTRAVENOUS | Status: AC
  Administered 2021-05-01: 4 mg via INTRAVENOUS
  Filled 2021-05-01: qty 1

## 2021-05-01 MED ORDER — ONDANSETRON HCL 4 MG/2ML IJ SOLN
4.0000 mg | Freq: Once | INTRAMUSCULAR | Status: AC
  Administered 2021-05-01: 4 mg via INTRAVENOUS
  Filled 2021-05-01: qty 2

## 2021-05-01 MED ORDER — SODIUM CHLORIDE 0.9 % IV BOLUS
1000.0000 mL | Freq: Once | INTRAVENOUS | Status: AC
  Administered 2021-05-01: 1000 mL via INTRAVENOUS

## 2021-05-01 MED ORDER — AMOXICILLIN-POT CLAVULANATE 875-125 MG PO TABS: 1.0000 | ORAL_TABLET | Freq: Two times a day (BID) | ORAL | 0 refills | Status: AC

## 2021-05-01 MED ORDER — AMOXICILLIN-POT CLAVULANATE 875-125 MG PO TABS: 1.0000 | ORAL_TABLET | Freq: Two times a day (BID) | ORAL | 0 refills | Status: DC

## 2021-05-01 MED ORDER — DEXAMETHASONE SODIUM PHOSPHATE 10 MG/ML IJ SOLN
6.0000 mg | Freq: Once | INTRAMUSCULAR | Status: AC
  Administered 2021-05-01: 6 mg via INTRAVENOUS
  Filled 2021-05-01: qty 1

## 2021-05-01 NOTE — ED Provider Triage Note (Signed)
Emergency Medicine Provider Triage Evaluation Note  Angela Brock , a 36 y.o. female  was evaluated in triage.  Pt complains of sore throat. Reports her kids have been sick with similar symptoms.  States that she can't swallow well due to pain. Is eating chickfila in triage.   Review of Systems  Positive: Sore throat, cough Negative: Fever, nasal congestion, N/V  Physical Exam  BP (!) 146/86    Pulse 99    Temp 99.2 F (37.3 C) (Oral)    Resp 14    SpO2 100%  Gen:   Awake, no distress   Resp:  Normal effort  MSK:   Moves extremities without difficulty  Other:    Medical Decision Making  Medically screening exam initiated at 12:55 PM.  Appropriate orders placed.  Angela Brock was informed that the remainder of the evaluation will be completed by another provider, this initial triage assessment does not replace that evaluation, and the importance of remaining in the ED until their evaluation is complete.  Patient does not want to be tested for flu/COVID because she does not think she has this. She only wants to be tested for strep.    Craig Wisnewski T, PA-C 05/01/21 1258

## 2021-05-01 NOTE — Discharge Instructions (Addendum)
Your work-up today showed you have tonsillitis.  I have sent in antibiotics to your pharmacy.  You can take Tylenol and ibuprofen for pain relief.  He also had a CT scan done of your neck.  This showed tonsillitis or inflammation of your tonsils.  You received a dose of steroids in the emergency room.  You got your first dose of antibiotic in the emergency room.

## 2021-05-01 NOTE — ED Triage Notes (Signed)
Pt eating [employer] during triage.  States she has a sore throat since last night and unable to eat.  Denies fever and chills.

## 2021-05-01 NOTE — ED Provider Notes (Signed)
[facility] EMERGENCY DEPARTMENT Provider Note   CSN: [id number] Arrival date & time: 05/01/21  1237     History  Chief Complaint  Patient presents with   Sore Throat    Angela Brock is a 36 y.o. female.  36 year old female presents today for evaluation of sore throat.  This has been going on since Thursday but reports significant worsening since last night.  States since last night she is having difficult time swallowing and tolerating p.o. intake.  Denies voice change, or drooling.  States her kids are also sick with sinus congestion, sore throat.  They have not been tested.  She does have [employer] at bedside however states she has not been able to tolerate it and that is making her symptoms worse.  She denies fever, or chills prior to arrival.  She has not taken anything prior to arrival.  The history is provided by the patient. No language interpreter was used.      Home Medications Prior to Admission medications   Medication Sig Start Date End Date Taking? Authorizing Provider  acetaminophen (TYLENOL) 325 MG tablet Take 2 tablets (650 mg total) by mouth every 4 (four) hours as needed (for pain scale < 4). 02/10/19   Huel Cote, MD  ibuprofen (ADVIL) 600 MG tablet Take 1 tablet (600 mg total) by mouth every 6 (six) hours. 02/10/19   Huel Cote, MD  Prenatal Vit-Fe Fumarate-FA (PRENATAL MULTIVITAMIN) TABS tablet Take 1 tablet by mouth daily at 12 noon.    [provider]      Allergies    Patient has no known allergies.    Review of Systems   Review of Systems  Constitutional:  Negative for chills and fever.  HENT:  Positive for sore throat and trouble swallowing. Negative for congestion, facial swelling, sinus pressure and voice change.   Respiratory:  Negative for cough and shortness of breath.   Gastrointestinal:  Negative for nausea and vomiting.  Neurological:  Negative for headaches.  All other systems reviewed and are  negative.  Physical Exam Updated Vital Signs BP (!) 151/91 (BP Location: Right Arm)    Pulse 85    Temp 99.2 F (37.3 C) (Oral)    Resp 15    LMP 04/24/2021    SpO2 100%  Physical Exam Vitals and nursing note reviewed.  Constitutional:      General: She is not in acute distress.    Appearance: Normal appearance. She is not ill-appearing.  HENT:     Head: Normocephalic and atraumatic.     Nose: Nose normal.     Mouth/Throat:     Mouth: Mucous membranes are moist.     Pharynx: Oropharynx is clear. Uvula midline. Posterior oropharyngeal erythema present.     Tonsils: No tonsillar exudate or tonsillar abscesses.  Eyes:     General: No scleral icterus.    Extraocular Movements: Extraocular movements intact.     Conjunctiva/sclera: Conjunctivae normal.  Cardiovascular:     Rate and Rhythm: Normal rate and regular rhythm.     Pulses: Normal pulses.     Heart sounds: Normal heart sounds.  Pulmonary:     Effort: Pulmonary effort is normal. No respiratory distress.     Breath sounds: Normal breath sounds. No wheezing or rales.  Abdominal:     General: There is no distension.     Tenderness: There is no abdominal tenderness.  Musculoskeletal:        General: Normal range of motion.  Cervical back: Normal range of motion.  Skin:    General: Skin is warm and dry.  Neurological:     General: No focal deficit present.     Mental Status: She is alert. Mental status is at baseline.    ED Results / Procedures / Treatments   Labs (all labs ordered are listed, but only abnormal results are displayed) Labs Reviewed  CBC WITH DIFFERENTIAL/PLATELET - Abnormal; Notable for the following components:      Result Value   WBC 20.9 (*)    Hemoglobin 11.7 (*)    RDW 15.7 (*)    Platelets 403 (*)    Neutro Abs 16.8 (*)    Monocytes Absolute 1.7 (*)    Abs Immature Granulocytes 0.09 (*)    All other components within normal limits  RESP PANEL BY RT-PCR (FLU A&B, COVID) ARPGX2  GROUP A  STREP BY PCR  BASIC METABOLIC PANEL  I-STAT CHEM 8, ED    EKG None  Radiology No results found.  Procedures Procedures    Medications Ordered in ED Medications  sodium chloride 0.9 % bolus 1,000 mL (1,000 mLs Intravenous New Bag/Given 05/01/21 1455)  morphine (PF) 4 MG/ML injection 4 mg (4 mg Intravenous Given 05/01/21 1456)  ondansetron (ZOFRAN) injection 4 mg (4 mg Intravenous Given 05/01/21 1455)    ED Course/ Medical Decision Making/ A&P                           Medical Decision Making Amount and/or Complexity of Data Reviewed Labs: ordered. Radiology: ordered.  Risk Prescription drug management.   Medical Decision Making / ED Course   This patient presents to the ED for concern of sore throat, difficulty swallowing, this involves an extensive number of treatment options, and is a complaint that carries with it a high risk of complications and morbidity.  The differential diagnosis includes strep pharyngitis, peritonsillar abscess, retropharyngeal abscess, Ludwick's angina, URI, dental abscess, Boerhaave's, anaphylaxis  MDM: 36 year old female presents today for evaluation of sore throat and difficulty swallowing.  Symptoms have been ongoing since Thursday but worse since last night.  Appears stable and without acute distress.  Vital signs are stable.  Does have mild tachycardia.  Voice normal.  Able to swallow secretions.  Denies cough, sinus congestion, or shortness of breath.  Unlikely to be anaphylaxis given duration of symptoms.  Given clinical picture and without acute distress unlikely to be Boerhaave's.  Without apparent evidence of abscess.  Given severity of discomfort will order CT soft tissue neck to further evaluate.  Will order CBC, BMP, prep swab, respiratory panel.   Leukocytosis of 20.9 with left shift.  Hemoglobin of 11.7 which is around baseline.  Without other acute findings on CBC.  BMP unremarkable.  Respiratory panel negative for COVID and flu.   Strep positive.  CT soft tissue neck significant for tonsillitis.  Patient given Decadron and Augmentin.  Prescribed Augmentin on discharge.  Patient is appropriate for discharge.  Discharged in stable condition.   Lab Tests: -I ordered, reviewed, and interpreted labs.   The pertinent results include:   Labs Reviewed  CBC WITH DIFFERENTIAL/PLATELET - Abnormal; Notable for the following components:      Result Value   WBC 20.9 (*)    Hemoglobin 11.7 (*)    RDW 15.7 (*)    Platelets 403 (*)    Neutro Abs 16.8 (*)    Monocytes Absolute 1.7 (*)    Abs  Immature Granulocytes 0.09 (*)    All other components within normal limits  RESP PANEL BY RT-PCR (FLU A&B, COVID) ARPGX2  GROUP A STREP BY PCR  BASIC METABOLIC PANEL  I-STAT CHEM 8, ED      EKG  EKG Interpretation  Date/Time:    Ventricular Rate:    PR Interval:    QRS Duration:   QT Interval:    QTC Calculation:   R Axis:     Text Interpretation:           Imaging Studies ordered: I ordered imaging studies including CT soft tissue neck with contrast I independently visualized and interpreted imaging. I agree with the radiologist interpretation   Medicines ordered and prescription drug management: Meds ordered this encounter  Medications   sodium chloride 0.9 % bolus 1,000 mL   morphine (PF) 4 MG/ML injection 4 mg   ondansetron (ZOFRAN) injection 4 mg    -I have reviewed the patients home medicines and have made adjustments as needed  Reevaluation: After the interventions noted above, I reevaluated the patient and found that they have :improved Some improvement in pain following pain medicine and IV hydration.  Co morbidities that complicate the patient evaluation  Past Medical History:  Diagnosis Date   Abnormal Pap smear    age 17    BV (bacterial vaginosis)    Depression    after 2nd pregnancy   H/O menorrhagia    Headache(784.0)    Hx of chlamydia infection    36 years old   Hx of dysmenorrhea     Normal pregnancy, incidental 07/18/2011      Dispostion: Patient is appropriate for discharge.  Discharged in stable condition.  Return precautions discussed.   Final Clinical Impression(s) / ED Diagnoses Final diagnoses:  Strep pharyngitis    Rx / DC Orders ED Discharge Orders          Ordered    amoxicillin-clavulanate (AUGMENTIN) 875-125 MG tablet  Every 12 hours        05/01/21 1625              Marita [place], PA-C 05/01/21 1634    Gloris Manchester, MD 05/01/21 2240

## 2021-09-30 ENCOUNTER — Ambulatory Visit (HOSPITAL_COMMUNITY): Payer: Medicaid Other | Admitting: Clinical

## 2022-01-19 ENCOUNTER — Encounter (HOSPITAL_COMMUNITY): Payer: Self-pay

## 2022-01-19 ENCOUNTER — Inpatient Hospital Stay (HOSPITAL_COMMUNITY)
Admission: AD | Admit: 2022-01-19 | Discharge: 2022-01-19 | Disposition: A | Discharge status: Home / Self Care | Payer: Medicaid Other | Attending: Obstetrics and Gynecology | Admitting: Obstetrics and Gynecology

## 2022-01-19 DIAGNOSIS — R059 Cough, unspecified: Secondary | ICD-10-CM | POA: Insufficient documentation

## 2022-01-19 DIAGNOSIS — Z3201 Encounter for pregnancy test, result positive: Secondary | ICD-10-CM | POA: Diagnosis present

## 2022-01-19 DIAGNOSIS — O09521 Supervision of elderly multigravida, first trimester: Secondary | ICD-10-CM | POA: Insufficient documentation

## 2022-01-19 DIAGNOSIS — O26891 Other specified pregnancy related conditions, first trimester: Secondary | ICD-10-CM | POA: Diagnosis not present

## 2022-01-19 DIAGNOSIS — Z3A01 Less than 8 weeks gestation of pregnancy: Secondary | ICD-10-CM | POA: Diagnosis not present

## 2022-01-19 DIAGNOSIS — Z1152 Encounter for screening for COVID-19: Secondary | ICD-10-CM | POA: Insufficient documentation

## 2022-01-19 DIAGNOSIS — R6889 Other general symptoms and signs: Secondary | ICD-10-CM

## 2022-01-19 LAB — SARS CORONAVIRUS 2 BY RT PCR: SARS Coronavirus 2 by RT PCR: NEGATIVE

## 2022-01-19 NOTE — MAU Provider Note (Signed)
History     CSN: [id number]  Arrival date and time: 01/19/22 1600   Event Date/Time   First Provider Initiated Contact with Patient 01/19/22 1702      Chief Complaint  Patient presents with   Cough   Fatigue   Possible Pregnancy   Angela Brock is a 36 y.o. [id number] at [redacted]w[redacted]d by Estimated LMP of Dec 13, 2021. She presents today for Cough, Fatigue, and Possible Pregnancy.   She reports her symptoms worsened on Monday morning, she states she has been experiencing them since last week.  She states no one else is sick and she works at a call center. She states everyone is coughing at her job, but she is unaware of any known sickness. She states she has not been eating a lot, but has been drinking more. She reports having some dizziness when going from sitting to standing and some lightheadedness with driving.  She states she took some theraflu last night, but nothing before or after.     OB History     Gravida  5   Para  4   Term  4   Preterm  0   AB  0   Living  4      SAB  0   IAB  0   Ectopic  0   Multiple  0   Live Births  4           Past Medical History:  Diagnosis Date   Abnormal Pap smear    age 27    BV (bacterial vaginosis)    Depression    after 2nd pregnancy   H/O menorrhagia    Headache(784.0)    Hx of chlamydia infection    36 years old   Hx of dysmenorrhea    Normal pregnancy, incidental 07/18/2011    Past Surgical History:  Procedure Laterality Date   CESAREAN SECTION  10/26/2011   Procedure: CESAREAN SECTION;  Surgeon: Michael Litter, MD;  Location: [facility];  Service: Gynecology;  Laterality: N/A;    Family History  Problem Relation Age of Onset   Hypertension Mother    Depression Mother    Migraines Mother    Alcohol abuse Maternal Grandmother    Alcohol abuse Maternal Grandfather    Alcohol abuse Paternal Grandfather    Anesthesia problems Neg Hx     Social History   Tobacco Use   Smoking status: Former    Packs/day:  1.00    Types: Cigarettes   Smokeless tobacco: Never  Vaping Use   Vaping Use: Never used  Substance Use Topics   Alcohol use: No    Alcohol/week: 2.0 standard drinks of alcohol    Types: 2 Shots of liquor per week    Comment: not currently while pregnant    Drug use: Not Currently    Frequency: 2.0 times per week    Types: Marijuana    Comment: quit June 2016    Allergies: No Known Allergies  Medications Prior to Admission  Medication Sig Dispense Refill Last Dose   acetaminophen (TYLENOL) 325 MG tablet Take 2 tablets (650 mg total) by mouth every 4 (four) hours as needed (for pain scale < 4). 30 tablet 1    ibuprofen (ADVIL) 600 MG tablet Take 1 tablet (600 mg total) by mouth every 6 (six) hours. 30 tablet 0    Prenatal Vit-Fe Fumarate-FA (PRENATAL MULTIVITAMIN) TABS tablet Take 1 tablet by mouth daily at 12 noon.  Review of Systems  Constitutional:  Negative for chills and fever.  HENT:  Positive for rhinorrhea and sore throat (With coughing). Negative for congestion, sinus pressure and sinus pain.   Respiratory:  Positive for cough.   Gastrointestinal:  Negative for nausea and vomiting.  Genitourinary:  Negative for difficulty urinating, dysuria, vaginal bleeding and vaginal discharge.  Neurological:  Positive for dizziness (With standing) and light-headedness. Negative for headaches.   Physical Exam   Blood pressure (!) 146/75, pulse 76, temperature 98.4 F (36.9 C), temperature source Oral, resp. rate 18, height 5\' 4"  (1.626 m), weight 103 kg, last menstrual period 12/13/2021, SpO2 98 %, unknown if currently breastfeeding.  Physical Exam Vitals reviewed. Exam conducted with a chaperone present.  Constitutional:      Appearance: Normal appearance.  HENT:     Head: Normocephalic and atraumatic.  Eyes:     Conjunctiva/sclera: Conjunctivae normal.  Cardiovascular:     Rate and Rhythm: Normal rate.     Heart sounds: Normal heart sounds.  Pulmonary:     Effort:  Pulmonary effort is normal. No respiratory distress.     Breath sounds: Normal breath sounds.  Abdominal:     General: Bowel sounds are normal.     Palpations: Abdomen is soft.     Tenderness: There is no abdominal tenderness.  Musculoskeletal:        General: Normal range of motion.     Cervical back: Normal range of motion.  Skin:    General: Skin is warm and dry.  Neurological:     Mental Status: She is alert and oriented to person, place, and time.  Psychiatric:        Mood and Affect: Mood normal.        Behavior: Behavior normal.     MAU Course  Procedures   MDM UPT Respiratory Panel Assessment and Plan  36 year old [id number] at 5.2 weeks Flu-Like Symptoms  -POC reviewed. -Exam performed. -Nurse instructed to get respiratory panel. -Patient informed that results will be called if abnormal. -Discussed usage of OTC medications for treatment of symptoms. -Patient states she will be returning to [facility] care. -Instructed to call and make appt for early [facility] considering estimated LMP. -Precautions Reviewed. -Encouraged to call primary office or return to MAU if symptoms worsen or with the onset of new symptoms. -Discharged to home in stable condition.  [place] 01/19/2022, 5:02 PM

## 2022-01-19 NOTE — MAU Note (Signed)
Patient signed physical copy of AVS due to being discharged from the family room.

## 2022-01-19 NOTE — Discharge Instructions (Signed)

## 2022-01-19 NOTE — MAU Note (Signed)
.  Angela Brock is a 36 y.o. at Unknown here in MAU reporting: she is unsure if she is pregnant, she usually has regular periods, but is a couple days late. Has not taken a pregnancy test. She reports fatigue, productive cough, greenish mucous, lightheadness and dizzy when standing or changing positions. She has a sore throat (7/10) only when she coughs. These symptoms started a few days ago. She has not been around anyone who has been sick and has not taken any tests. Denies VB or abnormal discharge. LMP: 12/13/2021 Onset of complaint: Past couple of days Pain score: 7/10 Vitals:   01/19/22 1624  BP: (!) 146/75  Pulse: 76  Resp: 18  Temp: 98.4 F (36.9 C)  SpO2: 98%     FHT: Not indicated Lab orders placed from triage:  UA, Pregnancy

## 2022-01-20 LAB — POCT PREGNANCY, URINE: Preg Test, Ur: POSITIVE — AB

## 2022-03-03 LAB — OB RESULTS CONSOLE RUBELLA ANTIBODY, IGM: Rubella: IMMUNE

## 2022-03-03 LAB — OB RESULTS CONSOLE ABO/RH: RH Type: POSITIVE

## 2022-03-03 LAB — OB RESULTS CONSOLE ANTIBODY SCREEN: Antibody Screen: NEGATIVE

## 2022-03-03 LAB — OB RESULTS CONSOLE GC/CHLAMYDIA
Chlamydia: NEGATIVE
Neisseria Gonorrhea: NEGATIVE

## 2022-03-03 LAB — OB RESULTS CONSOLE RPR: RPR: NONREACTIVE

## 2022-03-03 LAB — OB RESULTS CONSOLE HIV ANTIBODY (ROUTINE TESTING): HIV: NONREACTIVE

## 2022-03-03 LAB — OB RESULTS CONSOLE HEPATITIS B SURFACE ANTIGEN: Hepatitis B Surface Ag: NEGATIVE

## 2022-03-03 LAB — HEPATITIS C ANTIBODY: HCV Ab: NEGATIVE

## 2022-08-29 LAB — OB RESULTS CONSOLE GBS: GBS: POSITIVE

## 2022-09-20 ENCOUNTER — Telehealth (HOSPITAL_COMMUNITY): Payer: Self-pay | Admitting: *Deleted

## 2022-09-20 NOTE — Telephone Encounter (Signed)
Preadmission screen  

## 2022-09-22 ENCOUNTER — Telehealth (HOSPITAL_COMMUNITY): Payer: Self-pay | Admitting: *Deleted

## 2022-09-22 ENCOUNTER — Encounter (HOSPITAL_COMMUNITY): Payer: Self-pay | Admitting: *Deleted

## 2022-09-22 NOTE — Telephone Encounter (Signed)
Preadmission screen  

## 2022-09-24 ENCOUNTER — Inpatient Hospital Stay (HOSPITAL_COMMUNITY): Payer: Medicaid Other | Admitting: Anesthesiology

## 2022-09-24 ENCOUNTER — Other Ambulatory Visit: Payer: Self-pay

## 2022-09-24 ENCOUNTER — Inpatient Hospital Stay (HOSPITAL_COMMUNITY)
Admit: 2022-09-24 | Discharge: 2022-09-28 | DRG: 788 | Disposition: A | Discharge status: Home / Self Care | Payer: Medicaid Other | Attending: Obstetrics and Gynecology | Admitting: Obstetrics and Gynecology

## 2022-09-24 ENCOUNTER — Encounter (HOSPITAL_COMMUNITY): Payer: Self-pay | Admitting: Obstetrics and Gynecology

## 2022-09-24 DIAGNOSIS — Z3A39 39 weeks gestation of pregnancy: Secondary | ICD-10-CM

## 2022-09-24 DIAGNOSIS — O34211 Maternal care for low transverse scar from previous cesarean delivery: Principal | ICD-10-CM | POA: Diagnosis present

## 2022-09-24 DIAGNOSIS — O99214 Obesity complicating childbirth: Secondary | ICD-10-CM | POA: Diagnosis present

## 2022-09-24 DIAGNOSIS — O9081 Anemia of the puerperium: Secondary | ICD-10-CM | POA: Diagnosis not present

## 2022-09-24 DIAGNOSIS — O99824 Streptococcus B carrier state complicating childbirth: Secondary | ICD-10-CM | POA: Diagnosis present

## 2022-09-24 DIAGNOSIS — Z3A40 40 weeks gestation of pregnancy: Secondary | ICD-10-CM

## 2022-09-24 DIAGNOSIS — Z87891 Personal history of nicotine dependence: Secondary | ICD-10-CM

## 2022-09-24 DIAGNOSIS — Z98891 History of uterine scar from previous surgery: Principal | ICD-10-CM

## 2022-09-24 DIAGNOSIS — O26893 Other specified pregnancy related conditions, third trimester: Secondary | ICD-10-CM | POA: Diagnosis present

## 2022-09-24 DIAGNOSIS — Z3490 Encounter for supervision of normal pregnancy, unspecified, unspecified trimester: Secondary | ICD-10-CM

## 2022-09-24 LAB — CBC
HCT: 32 % — ABNORMAL LOW (ref 36.0–46.0)
Hemoglobin: 10.4 g/dL — ABNORMAL LOW (ref 12.0–15.0)
MCH: 26.3 pg (ref 26.0–34.0)
MCHC: 32.5 g/dL (ref 30.0–36.0)
MCV: 81 fL (ref 80.0–100.0)
Platelets: 307 10*3/uL (ref 150–400)
RBC: 3.95 MIL/uL (ref 3.87–5.11)
RDW: 15 % (ref 11.5–15.5)
WBC: 9.9 10*3/uL (ref 4.0–10.5)
nRBC: 0 % (ref 0.0–0.2)

## 2022-09-24 LAB — TYPE AND SCREEN
ABO/RH(D): A POS
Antibody Screen: NEGATIVE

## 2022-09-24 MED ORDER — SOD CITRATE-CITRIC ACID 500-334 MG/5ML PO SOLN
30.0000 mL | ORAL | Status: DC | PRN
  Administered 2022-09-24: 30 mL via ORAL
  Filled 2022-09-24 (×2): qty 30

## 2022-09-24 MED ORDER — OXYTOCIN-SODIUM CHLORIDE 30-0.9 UT/500ML-% IV SOLN
1.0000 m[IU]/min | INTRAVENOUS | Status: DC
  Administered 2022-09-24: 2 m[IU]/min via INTRAVENOUS
  Filled 2022-09-24: qty 500

## 2022-09-24 MED ORDER — DIPHENHYDRAMINE HCL 50 MG/ML IJ SOLN: 12.5000 mg | INTRAMUSCULAR | Status: DC | PRN

## 2022-09-24 MED ORDER — ACETAMINOPHEN 325 MG PO TABS: 650.0000 mg | ORAL_TABLET | ORAL | Status: DC | PRN

## 2022-09-24 MED ORDER — PHENYLEPHRINE 80 MCG/ML (10ML) SYRINGE FOR IV PUSH (FOR BLOOD PRESSURE SUPPORT): 80.0000 ug | PREFILLED_SYRINGE | INTRAVENOUS | Status: DC | PRN

## 2022-09-24 MED ORDER — LACTATED RINGERS IV SOLN
500.0000 mL | INTRAVENOUS | Status: DC | PRN
  Administered 2022-09-25 (×2): 500 mL via INTRAVENOUS

## 2022-09-24 MED ORDER — OXYTOCIN BOLUS FROM INFUSION: 333.0000 mL | Freq: Once | INTRAVENOUS | Status: DC

## 2022-09-24 MED ORDER — ONDANSETRON HCL 4 MG/2ML IJ SOLN: 4.0000 mg | Freq: Four times a day (QID) | INTRAMUSCULAR | Status: DC | PRN

## 2022-09-24 MED ORDER — LIDOCAINE HCL (PF) 1 % IJ SOLN: 30.0000 mL | INTRAMUSCULAR | Status: DC | PRN

## 2022-09-24 MED ORDER — SODIUM CHLORIDE 0.9 % IV SOLN
5.0000 10*6.[IU] | Freq: Once | INTRAVENOUS | Status: AC
  Administered 2022-09-24: 5 10*6.[IU] via INTRAVENOUS
  Filled 2022-09-24: qty 5

## 2022-09-24 MED ORDER — OXYCODONE-ACETAMINOPHEN 5-325 MG PO TABS: 1.0000 | ORAL_TABLET | ORAL | Status: DC | PRN

## 2022-09-24 MED ORDER — TERBUTALINE SULFATE 1 MG/ML IJ SOLN: 0.2500 mg | Freq: Once | INTRAMUSCULAR | Status: DC | PRN

## 2022-09-24 MED ORDER — OXYTOCIN-SODIUM CHLORIDE 30-0.9 UT/500ML-% IV SOLN: 2.5000 [IU]/h | INTRAVENOUS | Status: DC

## 2022-09-24 MED ORDER — LACTATED RINGERS IV SOLN: 500.0000 mL | Freq: Once | INTRAVENOUS | Status: DC

## 2022-09-24 MED ORDER — PENICILLIN G POT IN DEXTROSE 60000 UNIT/ML IV SOLN
3.0000 10*6.[IU] | INTRAVENOUS | Status: DC
  Administered 2022-09-24 – 2022-09-25 (×3): 3 10*6.[IU] via INTRAVENOUS
  Filled 2022-09-24 (×3): qty 50

## 2022-09-24 MED ORDER — FENTANYL-BUPIVACAINE-NACL 0.5-0.125-0.9 MG/250ML-% EP SOLN
12.0000 mL/h | EPIDURAL | Status: DC | PRN
  Administered 2022-09-24: 12 mL/h via EPIDURAL
  Filled 2022-09-24: qty 250

## 2022-09-24 MED ORDER — LACTATED RINGERS IV SOLN: INTRAVENOUS | Status: DC

## 2022-09-24 MED ORDER — LIDOCAINE HCL (PF) 1 % IJ SOLN
INTRAMUSCULAR | Status: DC | PRN
  Administered 2022-09-24 (×2): 5 mL via EPIDURAL

## 2022-09-24 MED ORDER — EPHEDRINE 5 MG/ML INJ: 10.0000 mg | INTRAVENOUS | Status: DC | PRN

## 2022-09-24 MED ORDER — OXYCODONE-ACETAMINOPHEN 5-325 MG PO TABS: 2.0000 | ORAL_TABLET | ORAL | Status: DC | PRN

## 2022-09-24 NOTE — Progress Notes (Signed)
Patient not feeling a lot of contraction pain, but feels the pain is due to the baby position on the pelvis.  Otherwise doing well FHT: 150 mod var +accels, no decels TOCO: q2-4 SVE: 4/70/-3 A/P: Head applied well, AROM clear Continue pit 2x2 Continue PCN for GBS EFW 8.5-9#

## 2022-09-24 NOTE — Anesthesia Procedure Notes (Signed)
Epidural Patient location during procedure: OB Start time: 09/24/2022 10:20 PM End time: 09/24/2022 10:29 PM  Staffing Anesthesiologist: Mal Amabile, MD Performed: anesthesiologist   Preanesthetic Checklist Completed: patient identified, IV checked, site marked, risks and benefits discussed, surgical consent, monitors and equipment checked, pre-op evaluation and timeout performed  Epidural Patient position: sitting Prep: DuraPrep and site prepped and draped Patient monitoring: continuous pulse ox and blood pressure Approach: midline Location: L3-L4 Injection technique: LOR air  Needle:  Needle type: Tuohy  Needle gauge: 17 G Needle length: 9 cm and 9 Needle insertion depth: 8 cm Catheter type: closed end flexible Catheter size: 19 Gauge Catheter at skin depth: 14 cm Test dose: negative and Other  Assessment Events: blood not aspirated, no cerebrospinal fluid, injection not painful, no injection resistance, no paresthesia and negative IV test  Additional Notes Patient identified. Risks and benefits discussed including failed block, incomplete  Pain control, post dural puncture headache, nerve damage, paralysis, blood pressure Changes, nausea, vomiting, reactions to medications-both toxic and allergic and post Partum back pain. All questions were answered. Patient expressed understanding and wished to proceed. Sterile technique was used throughout procedure. Epidural site was Dressed with sterile barrier dressing. No paresthesias, signs of intravascular injection Or signs of intrathecal spread were encountered.  Patient was more comfortable after the epidural was dosed. Please see RN's note for documentation of vital signs and FHR which are stable. Reason for block:procedure for pain

## 2022-09-24 NOTE — Anesthesia Preprocedure Evaluation (Addendum)
Anesthesia Evaluation  Patient identified by MRN, date of birth, ID band Patient awake    Reviewed: Allergy & Precautions, Patient's Chart, lab work & pertinent test results  Airway Mallampati: II       Dental no notable dental hx.    Pulmonary former smoker   Pulmonary exam normal        Cardiovascular negative cardio ROS Normal cardiovascular exam Rhythm:Regular     Neuro/Psych  Headaches PSYCHIATRIC DISORDERS  Depression       GI/Hepatic Neg liver ROS,GERD  ,,  Endo/Other    Morbid obesity  Renal/GU negative Renal ROS  negative genitourinary   Musculoskeletal negative musculoskeletal ROS (+)    Abdominal  (+) + obese  Peds  Hematology  (+) Blood dyscrasia, anemia   Anesthesia Other Findings   Reproductive/Obstetrics (+) Pregnancy AMA Previous C/Section x 1 with successful VBAC                              Anesthesia Physical Anesthesia Plan  ASA: 3  Anesthesia Plan: Epidural   Post-op Pain Management:    Induction:   PONV Risk Score and Plan:   Airway Management Planned: Natural Airway  Additional Equipment:   Intra-op Plan:   Post-operative Plan:   Informed Consent: I have reviewed the patients History and Physical, chart, labs and discussed the procedure including the risks, benefits and alternatives for the proposed anesthesia with the patient or authorized representative who has indicated his/her understanding and acceptance.       Plan Discussed with: Anesthesiologist  Anesthesia Plan Comments:          Anesthesia Quick Evaluation

## 2022-09-24 NOTE — H&P (Signed)
37 y.o. [redacted]w[redacted]d  [id number] comes in for schedule elective IOL at term.  Otherwise has good fetal movement and no bleeding.  Past Medical History:  Diagnosis Date   Abnormal Pap smear    age 89    BV (bacterial vaginosis)    Depression    after 2nd pregnancy   H/O menorrhagia    Headache(784.0)    Hx of chlamydia infection    37 years old   Hx of dysmenorrhea    Normal pregnancy, incidental 07/18/2011    Past Surgical History:  Procedure Laterality Date   CESAREAN SECTION  10/26/2011   Procedure: CESAREAN SECTION;  Surgeon: Michael Litter, MD;  Location: [facility];  Service: Gynecology;  Laterality: N/A;    OB History  Gravida Para Term Preterm AB Living  5 4 4  0 0 4  SAB IAB Ectopic Multiple Live Births  0 0 0 0 4    # Outcome Date GA Lbr Len/2nd Weight Sex Type Anes PTL Lv  5 Current           4 Term 02/08/19 [redacted]w[redacted]d 03:23 / 00:24 3135 g F Vag-Spont EPI  LIV     Birth Comments: WNL  3 Term 05/30/17 [redacted]w[redacted]d 09:30 / 00:30 4190 g M VBAC, Vacuum EPI  LIV  2 Term 05/06/15 [redacted]w[redacted]d 11:31 / 01:16 3175 g M VBAC, Vacuum Local, EPI  LIV  1 Term 10/26/11 [redacted]w[redacted]d  3575 g F CS-LTranv EPI  LIV    Social History   Socioeconomic History   Marital status: Single    Spouse name: Not on file   Number of children: Not on file   Years of education: Not on file   Highest education level: Not on file  Occupational History   Not on file  Tobacco Use   Smoking status: Former    Current packs/day: 1.00    Types: Cigarettes   Smokeless tobacco: Never  Vaping Use   Vaping status: Never Used  Substance and Sexual Activity   Alcohol use: No    Alcohol/week: 2.0 standard drinks of alcohol    Types: 2 Shots of liquor per week    Comment: not currently while pregnant    Drug use: Not Currently    Frequency: 2.0 times per week    Types: Marijuana    Comment: quit June 2016   Sexual activity: Yes    Birth control/protection: None    Comment: currently pregnant  Other Topics Concern   Not on file   Social History Narrative   Not on file   Social Determinants of Health   Financial Resource Strain: Not on file  Food Insecurity: Not on file  Transportation Needs: Not on file  Physical Activity: Not on file  Stress: Not on file  Social Connections: Not on file  Intimate Partner Violence: Not At Risk (02/08/2019)   Humiliation, Afraid, Rape, and Kick questionnaire    Fear of Current or Ex-Partner: No    Emotionally Abused: No    Physically Abused: No    Sexually Abused: No   Patient has no known allergies.    Prenatal Transfer Tool  Maternal Diabetes: No Genetic Screening: Declined Maternal Ultrasounds/Referrals: Normal Fetal Ultrasounds or other Referrals:  None Maternal Substance Abuse:  No Significant Maternal Medications:  None Significant Maternal Lab Results: Group B Strep positive  Other PNC: AMA    Vitals:   09/24/22 1209  Weight: 122.9 kg  Height: 5\' 4"  (1.626 m)    Lungs/Cor:  NAD Abdomen:  soft, gravid Ex:  no cords, erythema SVE:  pending FHTs:  150 mod var, on monitor only for a few mins Toco:  one ctx   A/P   Admit [id number] @39 .6 for TOLAC, one prior C/S with 3 subsequent successful VBACs  GBS Pos: PCN  Pitocin 2x2  Philip Aspen

## 2022-09-25 ENCOUNTER — Other Ambulatory Visit: Payer: Self-pay

## 2022-09-25 ENCOUNTER — Encounter (HOSPITAL_COMMUNITY): Payer: Self-pay | Admitting: Obstetrics and Gynecology

## 2022-09-25 ENCOUNTER — Encounter (HOSPITAL_COMMUNITY)
Disposition: A | Discharge status: Home / Self Care | Payer: Self-pay | Source: Home / Self Care | Attending: Obstetrics and Gynecology

## 2022-09-25 LAB — DIC (DISSEMINATED INTRAVASCULAR COAGULATION)PANEL
D-Dimer, Quant: 3.4 ug/mL-FEU — ABNORMAL HIGH (ref 0.00–0.50)
Fibrinogen: 409 mg/dL (ref 210–475)
INR: 1.1 (ref 0.8–1.2)
Platelets: 276 10*3/uL (ref 150–400)
Prothrombin Time: 14.8 seconds (ref 11.4–15.2)
Smear Review: NONE SEEN
aPTT: 26 seconds (ref 24–36)

## 2022-09-25 LAB — CBC
HCT: 29.8 % — ABNORMAL LOW (ref 36.0–46.0)
HCT: 27.9 % — ABNORMAL LOW (ref 36.0–46.0)
Hemoglobin: 9.6 g/dL — ABNORMAL LOW (ref 12.0–15.0)
Hemoglobin: 9.1 g/dL — ABNORMAL LOW (ref 12.0–15.0)
MCH: 26.8 pg (ref 26.0–34.0)
MCH: 27.1 pg (ref 26.0–34.0)
MCHC: 32.2 g/dL (ref 30.0–36.0)
MCHC: 32.6 g/dL (ref 30.0–36.0)
MCV: 83.2 fL (ref 80.0–100.0)
MCV: 83 fL (ref 80.0–100.0)
Platelets: 265 10*3/uL (ref 150–400)
Platelets: 278 10*3/uL (ref 150–400)
RBC: 3.58 MIL/uL — ABNORMAL LOW (ref 3.87–5.11)
RBC: 3.36 MIL/uL — ABNORMAL LOW (ref 3.87–5.11)
RDW: 15.2 % (ref 11.5–15.5)
RDW: 15 % (ref 11.5–15.5)
WBC: 20.7 10*3/uL — ABNORMAL HIGH (ref 4.0–10.5)
WBC: 19.4 10*3/uL — ABNORMAL HIGH (ref 4.0–10.5)
nRBC: 0 % (ref 0.0–0.2)
nRBC: 0 % (ref 0.0–0.2)

## 2022-09-25 LAB — RPR: RPR Ser Ql: NONREACTIVE

## 2022-09-25 LAB — CREATININE, SERUM
Creatinine, Ser: 0.62 mg/dL (ref 0.44–1.00)
GFR, Estimated: >60 mL/min (ref >60)

## 2022-09-25 SURGERY — Surgical Case
Anesthesia: Epidural

## 2022-09-25 MED ORDER — DIPHENHYDRAMINE HCL 25 MG PO CAPS: 25.0000 mg | ORAL_CAPSULE | ORAL | Status: DC | PRN

## 2022-09-25 MED ORDER — WITCH HAZEL-GLYCERIN EX PADS: 1.0000 | MEDICATED_PAD | CUTANEOUS | Status: DC | PRN

## 2022-09-25 MED ORDER — SIMETHICONE 80 MG PO CHEW
80.0000 mg | CHEWABLE_TABLET | Freq: Three times a day (TID) | ORAL | Status: DC
  Administered 2022-09-25 – 2022-09-28 (×7): 80 mg via ORAL
  Filled 2022-09-25 (×7): qty 1

## 2022-09-25 MED ORDER — MEPERIDINE HCL 25 MG/ML IJ SOLN: 6.2500 mg | INTRAMUSCULAR | Status: DC | PRN

## 2022-09-25 MED ORDER — DIPHENHYDRAMINE HCL 25 MG PO CAPS
25.0000 mg | ORAL_CAPSULE | Freq: Four times a day (QID) | ORAL | Status: DC | PRN
  Administered 2022-09-25 (×2): 25 mg via ORAL
  Filled 2022-09-25 (×2): qty 1

## 2022-09-25 MED ORDER — ONDANSETRON HCL 4 MG/2ML IJ SOLN
INTRAMUSCULAR | Status: DC | PRN
  Administered 2022-09-25: 4 mg via INTRAVENOUS

## 2022-09-25 MED ORDER — OXYTOCIN-SODIUM CHLORIDE 30-0.9 UT/500ML-% IV SOLN
INTRAVENOUS | Status: DC | PRN
  Administered 2022-09-25: 100 mL via INTRAVENOUS
  Administered 2022-09-25: 500 mL via INTRAVENOUS

## 2022-09-25 MED ORDER — LIDOCAINE-EPINEPHRINE (PF) 2 %-1:200000 IJ SOLN
INTRAMUSCULAR | Status: DC | PRN
  Administered 2022-09-25: 1 mL via EPIDURAL
  Administered 2022-09-25: 4 mL via EPIDURAL
  Administered 2022-09-25: 5 mL via EPIDURAL

## 2022-09-25 MED ORDER — TRANEXAMIC ACID-NACL 1000-0.7 MG/100ML-% IV SOLN
INTRAVENOUS | Status: DC | PRN
  Administered 2022-09-25 (×2): 1000 mg via INTRAVENOUS

## 2022-09-25 MED ORDER — ONDANSETRON HCL 4 MG/2ML IJ SOLN
4.0000 mg | Freq: Three times a day (TID) | INTRAMUSCULAR | Status: DC | PRN
  Administered 2022-09-25: 4 mg via INTRAVENOUS
  Filled 2022-09-25: qty 2

## 2022-09-25 MED ORDER — IBUPROFEN 600 MG PO TABS
600.0000 mg | ORAL_TABLET | Freq: Four times a day (QID) | ORAL | Status: AC
  Administered 2022-09-25 – 2022-09-28 (×12): 600 mg via ORAL
  Filled 2022-09-25 (×12): qty 1

## 2022-09-25 MED ORDER — FENTANYL CITRATE (PF) 100 MCG/2ML IJ SOLN
INTRAMUSCULAR | Status: DC | PRN
  Administered 2022-09-25: 100 ug via EPIDURAL

## 2022-09-25 MED ORDER — CEFAZOLIN IN SODIUM CHLORIDE 3-0.9 GM/100ML-% IV SOLN
INTRAVENOUS | Status: AC
  Filled 2022-09-25: qty 100

## 2022-09-25 MED ORDER — PHENYLEPHRINE 80 MCG/ML (10ML) SYRINGE FOR IV PUSH (FOR BLOOD PRESSURE SUPPORT)
PREFILLED_SYRINGE | INTRAVENOUS | Status: DC | PRN
  Administered 2022-09-25 (×3): 80 ug via INTRAVENOUS

## 2022-09-25 MED ORDER — MISOPROSTOL 200 MCG PO TABS
1000.0000 ug | ORAL_TABLET | Freq: Once | ORAL | Status: AC
  Administered 2022-09-25: 1000 ug via RECTAL

## 2022-09-25 MED ORDER — TRANEXAMIC ACID-NACL 1000-0.7 MG/100ML-% IV SOLN
INTRAVENOUS | Status: AC
  Filled 2022-09-25: qty 100

## 2022-09-25 MED ORDER — METOCLOPRAMIDE HCL 5 MG/ML IJ SOLN
INTRAMUSCULAR | Status: DC | PRN
  Administered 2022-09-25: 10 mg via INTRAVENOUS

## 2022-09-25 MED ORDER — OXYTOCIN-SODIUM CHLORIDE 30-0.9 UT/500ML-% IV SOLN
INTRAVENOUS | Status: AC
  Filled 2022-09-25: qty 500

## 2022-09-25 MED ORDER — ACETAMINOPHEN 10 MG/ML IV SOLN
INTRAVENOUS | Status: AC
  Filled 2022-09-25: qty 100

## 2022-09-25 MED ORDER — DEXAMETHASONE SODIUM PHOSPHATE 10 MG/ML IJ SOLN
INTRAMUSCULAR | Status: DC | PRN
  Administered 2022-09-25: 10 mg via INTRAVENOUS

## 2022-09-25 MED ORDER — ACETAMINOPHEN 10 MG/ML IV SOLN
INTRAVENOUS | Status: DC | PRN
  Administered 2022-09-25: 1000 mg via INTRAVENOUS

## 2022-09-25 MED ORDER — NALOXONE HCL 4 MG/10ML IJ SOLN: 1.0000 ug/kg/h | INTRAVENOUS | Status: DC | PRN

## 2022-09-25 MED ORDER — SENNOSIDES-DOCUSATE SODIUM 8.6-50 MG PO TABS
2.0000 | ORAL_TABLET | Freq: Every day | ORAL | Status: DC
  Administered 2022-09-26 – 2022-09-28 (×3): 2 via ORAL
  Filled 2022-09-25 (×3): qty 2

## 2022-09-25 MED ORDER — MORPHINE SULFATE (PF) 0.5 MG/ML IJ SOLN
INTRAMUSCULAR | Status: DC | PRN
  Administered 2022-09-25: 3 mg via EPIDURAL

## 2022-09-25 MED ORDER — NALOXONE HCL 0.4 MG/ML IJ SOLN: 0.4000 mg | INTRAMUSCULAR | Status: DC | PRN

## 2022-09-25 MED ORDER — STERILE WATER FOR IRRIGATION IR SOLN
Status: DC | PRN
  Administered 2022-09-25: 1000 mL

## 2022-09-25 MED ORDER — SODIUM CHLORIDE 0.9% FLUSH: 3.0000 mL | INTRAVENOUS | Status: DC | PRN

## 2022-09-25 MED ORDER — FENTANYL CITRATE (PF) 100 MCG/2ML IJ SOLN
INTRAMUSCULAR | Status: AC
  Filled 2022-09-25: qty 2

## 2022-09-25 MED ORDER — CEFAZOLIN IN SODIUM CHLORIDE 3-0.9 GM/100ML-% IV SOLN: 3.0000 g | INTRAVENOUS | Status: DC

## 2022-09-25 MED ORDER — OXYCODONE HCL 5 MG PO TABS
5.0000 mg | ORAL_TABLET | ORAL | Status: DC | PRN
  Administered 2022-09-26: 5 mg via ORAL
  Administered 2022-09-26: 10 mg via ORAL
  Administered 2022-09-26: 5 mg via ORAL
  Administered 2022-09-27: 10 mg via ORAL
  Filled 2022-09-25: qty 1
  Filled 2022-09-25 (×2): qty 2
  Filled 2022-09-25: qty 1

## 2022-09-25 MED ORDER — COCONUT OIL OIL
1.0000 | TOPICAL_OIL | Status: DC | PRN
  Administered 2022-09-26: 1 via TOPICAL

## 2022-09-25 MED ORDER — METHYLERGONOVINE MALEATE 0.2 MG/ML IJ SOLN
INTRAMUSCULAR | Status: AC
  Filled 2022-09-25: qty 1

## 2022-09-25 MED ORDER — MENTHOL 3 MG MT LOZG: 1.0000 | LOZENGE | OROMUCOSAL | Status: DC | PRN

## 2022-09-25 MED ORDER — ENOXAPARIN SODIUM 60 MG/0.6ML IJ SOSY
60.0000 mg | PREFILLED_SYRINGE | INTRAMUSCULAR | Status: DC
  Administered 2022-09-25 – 2022-09-27 (×3): 60 mg via SUBCUTANEOUS
  Filled 2022-09-25 (×3): qty 0.6

## 2022-09-25 MED ORDER — SIMETHICONE 80 MG PO CHEW: 80.0000 mg | CHEWABLE_TABLET | ORAL | Status: DC | PRN

## 2022-09-25 MED ORDER — MISOPROSTOL 200 MCG PO TABS
ORAL_TABLET | ORAL | Status: AC
  Filled 2022-09-25: qty 5

## 2022-09-25 MED ORDER — ACETAMINOPHEN 325 MG PO TABS
650.0000 mg | ORAL_TABLET | ORAL | Status: DC | PRN
  Administered 2022-09-25 – 2022-09-28 (×7): 650 mg via ORAL
  Filled 2022-09-25 (×7): qty 2

## 2022-09-25 MED ORDER — CEFAZOLIN SODIUM-DEXTROSE 2-3 GM-%(50ML) IV SOLR
INTRAVENOUS | Status: DC | PRN
  Administered 2022-09-25 (×2): 3 g via INTRAVENOUS

## 2022-09-25 MED ORDER — DIPHENHYDRAMINE HCL 50 MG/ML IJ SOLN: 12.5000 mg | INTRAMUSCULAR | Status: DC | PRN

## 2022-09-25 MED ORDER — LACTATED RINGERS IV SOLN: INTRAVENOUS | Status: DC | PRN

## 2022-09-25 MED ORDER — ZOLPIDEM TARTRATE 5 MG PO TABS: 5.0000 mg | ORAL_TABLET | Freq: Every evening | ORAL | Status: DC | PRN

## 2022-09-25 MED ORDER — PRENATAL MULTIVITAMIN CH
1.0000 | ORAL_TABLET | Freq: Every day | ORAL | Status: DC
  Administered 2022-09-26 – 2022-09-27 (×2): 1 via ORAL
  Filled 2022-09-25 (×2): qty 1

## 2022-09-25 MED ORDER — SOD CITRATE-CITRIC ACID 500-334 MG/5ML PO SOLN
30.0000 mL | ORAL | Status: AC
  Administered 2022-09-25: 30 mL via ORAL

## 2022-09-25 MED ORDER — DIBUCAINE (PERIANAL) 1 % EX OINT: 1.0000 | TOPICAL_OINTMENT | CUTANEOUS | Status: DC | PRN

## 2022-09-25 MED ORDER — OXYTOCIN-SODIUM CHLORIDE 30-0.9 UT/500ML-% IV SOLN: INTRAVENOUS | Status: DC | PRN

## 2022-09-25 MED ORDER — FENTANYL CITRATE (PF) 100 MCG/2ML IJ SOLN
25.0000 ug | INTRAMUSCULAR | Status: DC | PRN
  Administered 2022-09-25: 25 ug via INTRAVENOUS

## 2022-09-25 MED ORDER — SODIUM CHLORIDE 0.9 % IV SOLN
500.0000 mg | INTRAVENOUS | Status: AC
  Administered 2022-09-25: 500 mg via INTRAVENOUS

## 2022-09-25 MED ORDER — LACTATED RINGERS AMNIOINFUSION: INTRAVENOUS | Status: DC

## 2022-09-25 MED ORDER — MORPHINE SULFATE (PF) 0.5 MG/ML IJ SOLN
INTRAMUSCULAR | Status: AC
  Filled 2022-09-25: qty 10

## 2022-09-25 MED ORDER — OXYTOCIN-SODIUM CHLORIDE 30-0.9 UT/500ML-% IV SOLN
2.5000 [IU]/h | INTRAVENOUS | Status: AC
  Administered 2022-09-25: 2.5 [IU]/h via INTRAVENOUS
  Filled 2022-09-25: qty 500

## 2022-09-25 SURGICAL SUPPLY — 38 items
APL PRP STRL LF DISP 70% ISPRP (MISCELLANEOUS) ×2
APL SKNCLS STERI-STRIP NONHPOA (GAUZE/BANDAGES/DRESSINGS) ×2
BENZOIN TINCTURE PRP APPL 2/3 (GAUZE/BANDAGES/DRESSINGS) IMPLANT
CHLORAPREP W/TINT 26 (MISCELLANEOUS) ×2 IMPLANT
CLAMP UMBILICAL CORD (MISCELLANEOUS) ×1 IMPLANT
CLIP FILSHIE TUBAL LIGA STRL (Clip) IMPLANT
CLOTH BEACON ORANGE TIMEOUT ST (SAFETY) ×1 IMPLANT
DRSG OPSITE POSTOP 4X10 (GAUZE/BANDAGES/DRESSINGS) ×1 IMPLANT
ELECT REM PT RETURN 9FT ADLT (ELECTROSURGICAL) ×1
ELECTRODE REM PT RTRN 9FT ADLT (ELECTROSURGICAL) ×1 IMPLANT
EXTRACTOR VACUUM M CUP 4 TUBE (SUCTIONS) IMPLANT
GAUZE SPONGE 4X4 12PLY STRL LF (GAUZE/BANDAGES/DRESSINGS) IMPLANT
GLOVE BIOGEL M 6.5 STRL (GLOVE) ×1 IMPLANT
GLOVE BIOGEL PI IND STRL 6.5 (GLOVE) ×1 IMPLANT
GLOVE BIOGEL PI IND STRL 7.0 (GLOVE) ×2 IMPLANT
GOWN STRL REUS W/TWL LRG LVL3 (GOWN DISPOSABLE) ×2 IMPLANT
HEMOSTAT ARISTA ABSORB 3G PWDR (HEMOSTASIS) IMPLANT
KIT ABG SYR 3ML LUER SLIP (SYRINGE) IMPLANT
MAT PREVALON FULL STRYKER (MISCELLANEOUS) IMPLANT
NDL HYPO 25X5/8 SAFETYGLIDE (NEEDLE) IMPLANT
NEEDLE HYPO 25X5/8 SAFETYGLIDE (NEEDLE) IMPLANT
NS IRRIG 1000ML POUR BTL (IV SOLUTION) ×1 IMPLANT
PACK C SECTION WH (CUSTOM PROCEDURE TRAY) ×1 IMPLANT
PAD ABD 7.5X8 STRL (GAUZE/BANDAGES/DRESSINGS) IMPLANT
PAD OB MATERNITY 4.3X12.25 (PERSONAL CARE ITEMS) ×1 IMPLANT
RETRACTOR TRAXI PANNICULUS (MISCELLANEOUS) IMPLANT
RTRCTR C-SECT PINK 25CM LRG (MISCELLANEOUS) ×1 IMPLANT
SET BERKELEY SUCTION TUBING (SUCTIONS) IMPLANT
SUT MON AB 2-0 CT1 27 (SUTURE) ×1 IMPLANT
SUT PDS AB 0 CTX 60 (SUTURE) IMPLANT
SUT PLAIN 2 0 XLH (SUTURE) IMPLANT
SUT VIC AB 0 CTX 36 (SUTURE) ×8
SUT VIC AB 0 CTX36XBRD ANBCTRL (SUTURE) ×4 IMPLANT
SUT VIC AB 4-0 KS 27 (SUTURE) ×1 IMPLANT
TOWEL OR 17X24 6PK STRL BLUE (TOWEL DISPOSABLE) ×1 IMPLANT
TRAY FOLEY W/BAG SLVR 14FR LF (SET/KITS/TRAYS/PACK) ×1 IMPLANT
WATER STERILE IRR 1000ML POUR (IV SOLUTION) ×1 IMPLANT
YANKAUER SUCT BULB TIP NO VENT (SUCTIONS) IMPLANT

## 2022-09-25 NOTE — Brief Op Note (Signed)
09/25/2022  7:46 AM  PATIENT:  Angela Brock  37 y.o. female  PRE-OPERATIVE DIAGNOSIS:  Persistent category 2 fetal tracing remote from delivery with arrest of dilation  POST-OPERATIVE DIAGNOSIS:  same  PROCEDURE:  Procedure(s): CESAREAN SECTION (N/A)- low transverse  SURGEON:  Surgeons and Role:    Philip Aspen, DO - Primary    * Gerald Leitz, MD - Assisting  An experienced surgical assistant was required given the standard of surgical care and the complexity of the case.  The assistant was needed for exposure, suctioning, dissection, retraction, instrument exchange, assist with delivery and administration of fundal pressure as well as overall help during the surgery  ANESTHESIA:   epidural  EBL:  approx 2400cc (amniotic fluid also suctioned and included so likely somewhat overestimated)  BLOOD ADMINISTERED:none, pt hemodynamically stable throughout and at conclusion of surgery  SPECIMEN:  Source of Specimen:  cord blood  FINDINGS: female infant, cephalic presentation, APGARS 8/9, wt 9#1, normal tubes and ovaries bilaterally  DISPOSITION OF SPECIMEN:  N/A  COUNTS:  YES  PLAN OF CARE: Admit to inpatient   PATIENT DISPOSITION:  PACU - hemodynamically stable.   Delay start of Pharmacological VTE agent (>24hrs) due to surgical blood loss or risk of bleeding: yes

## 2022-09-25 NOTE — Op Note (Signed)
[name], CHALOUPKA MEDICAL RECORD NO: [id number] ACCOUNT NO: [id number] DATE OF BIRTH: Aug 25, 1985 FACILITY: [facility] LOCATION: [id number] PHYSICIAN: Virgina Evener. [name], DO  Operative Report   DATE OF PROCEDURE: 09/25/2022  PREOPERATIVE DIAGNOSIS:  Persistent category 2 fetal tracing remote from delivery with arrest of dilation.  POSTOPERATIVE DIAGNOSIS:  Persistent category 2 fetal tracing remote from delivery with arrest of dilation.  PROCEDURE:  Low transverse cesarean section.  SURGEON:  Philip Aspen, DO  ASSISTANT:  Gerald Leitz, MD.  Of note, an experienced surgical assistant was required given the standard of surgical care and the complexity of the case.  The assistant was needed for exposure, suctioning, dissection, retraction, instrument exchange,  assist with delivery and administration of fundal pressure as well as overall help during the surgery.  ANESTHESIA:  Epidural.  ESTIMATED BLOOD LOSS:  Approximately 2400 mL, although (amniotic fluid was also suctioned and included in this count, so it is somewhat overestimated, I suspect).  BLOOD ADMINISTERED:  None.  The patient remained hemodynamically stable throughout the case and at the conclusion of the case.  SPECIMENS:  Cord blood.  FINDINGS:  Female infant, cephalic presentation. Apgars 8 and 9, weight 9 pounds 1 ounce.  Tubes and ovaries normal bilaterally.  COMPLICATIONS:  None.  CONDITION:  Stable to PACU.  DESCRIPTION OF PROCEDURE:  The patient was taken to the operating room. After appropriate consenting and timeout, she was prepped and draped in the normal sterile fashion in dorsal supine position.  A Pfannenstiel skin incision was made and carried down  to underlying layer of fascia with Bovie cautery.  The fascia was incised in the midline with the scalpel and extended laterally with Mayo scissors.  Kocher clamps were placed at the superior aspect of the fascial incision and rectus muscles were  dissected off  bluntly and sharply.  Kocher clamps were then placed at the inferior aspect of the fascial incision and again rectus muscles were dissected off bluntly and sharply.  A hemostat was used to separate rectus muscles superiorly with good  visualization.  The peritoneum was entered bluntly and the peritoneal incision extended by manual lateral traction.  The abdomen and pelvis were manually surveyed and no scar tissue was felt.  An Pharmacologist was placed and the vesicouterine  peritoneum was then identified, tented and entered sharply with Metzenbaum scissors.  The incision was then extended laterally and the bladder flap was developed digitally.  A scalpel was used to make a low transverse cesarean incision and the amniotic  sac was entered sharply.  The hysterotomy was extended by cephalic and caudal traction and the infant's head was easily located, elevated and delivered without difficulty followed by the remainder of the infant's body.  After delayed cord clamping, the  cord was doubly cut, clamped and the infant was handed off to awaiting neonatology.  Brisk bleeding was noted from several large vessels along the hysterotomy site.  These were controlled with T-clamps while external massage of the uterus was performed  and TXA was administered.  The placenta was removed by gentle traction along with fundal massage and the uterus was cleared of all clot and debris.  The uterine incision was then reapproximated and closed with 0 Vicryl in a running locked fashion.  A  second layer of imbrication was performed and there were still additional bleeders at both angles and along the inferior aspect, so three additional mattress sutures were placed in figures of eight in both angles.  Pressure was placed while  the tubes and  ovaries were observed. When observing the incision again, it appeared to be hemostatic.  The Alexis self-retractor was removed and the incision was again visualized.  Minimal use of  Bovie cautery was needed for a small serosal bleeder. Arista powder was  applied along the incision as well.  No further bleeding was noted.  The peritoneum was then reapproximated and closed with Monocryl in a running fashion.  The fascia was reapproximated and closed with looped PDS in a running fashion.  The subcutaneous  tissue was reapproximated with chromic in 5 interrupted sutures after irrigation, drying and minimal use of Bovie cautery to control bleeders.  Skin was then reapproximated and closed with Vicryl on a Keith needle.  The patient tolerated the procedure  well.  Sponge, lap and needle counts were correct x 2.  The patient was taken to recovery in stable condition.   SHW D: 09/25/2022 8:00:10 am T: 09/25/2022 8:48:00 am  JOB: [id number]/ [id number]

## 2022-09-25 NOTE — Progress Notes (Signed)
Placed patient in hands/knees, baby still not tolerating. SVE:4/80/-2 to -3 TOCO q 2-3 FHT: 150 min to mod var, no accels +variable decels A/P: Discussed with patient persistent variable decels for multiple hours despite position changes, amnioinfusion, now with cervix that is feeling more swollen and somewhat less dilated than prior exam.  Suspect OP presentation.  Patient requests proceeding with C/S.  Declines tubal ligation.

## 2022-09-25 NOTE — Transfer of Care (Signed)
Immediate Anesthesia Transfer of Care Note  Patient: Angela Brock  Procedure(s) Performed: CESAREAN SECTION  Patient Location: PACU  Anesthesia Type:Epidural  Level of Consciousness: awake, alert , and oriented  Airway & Oxygen Therapy: Patient Spontanous Breathing  Post-op Assessment: Report given to RN and Post -op Vital signs reviewed and stable  Post vital signs: Reviewed and stable  Last Vitals:  Vitals Value Taken Time  BP 111/63 09/25/22 0753  Temp    Pulse 85 09/25/22 0757  Resp 19 09/25/22 0757  SpO2 100 % 09/25/22 0757  Vitals shown include unfiled device data.  Last Pain:  Vitals:   09/25/22 0400  TempSrc: Oral  PainSc:          Complications: No notable events documented.

## 2022-09-25 NOTE — Progress Notes (Signed)
Patient still with persistent variable decelerations despite position changes and amnioinfusion. FSE placed to facilitate tracing while attempting to place patient in hands/knees. FHT: 150 mn to mod var, no accels, + variable decels TOCO: q2-3 SVE: pending RN assessment with FSE placement Contiue pit 2x2

## 2022-09-25 NOTE — Progress Notes (Signed)
RN called to alert me of persistent variable decels despite position changes. Pt is comfortable s/p epidural. FHT: 150 mod var, +variable decels, accels difficult to assess d/t baseline changes TOCO: 2-4 SVE: 5/70/-2 to -3 A/P: Continue pitocin 2x2 IUPC placed, discussed importance of verifying fluid return Other routine care

## 2022-09-25 NOTE — Anesthesia Postprocedure Evaluation (Signed)
Anesthesia Post Note  Patient: JASMAIN CAWLEY  Procedure(s) Performed: CESAREAN SECTION     Patient location during evaluation: PACU Anesthesia Type: Epidural Level of consciousness: awake Pain management: pain level controlled Vital Signs Assessment: post-procedure vital signs reviewed and stable Respiratory status: spontaneous breathing, nonlabored ventilation and respiratory function stable Cardiovascular status: stable Postop Assessment: no headache, no backache and epidural receding Anesthetic complications: no Comments: Patient with ~2500 mL EBL. VSS remained stable. Patient asymptomatic. Post-op labs with Hgb 9.6, platelets 265, and normal PT/INR, PTT, and fibrinogen. Normal amount of bleeding in PACU per nurse. Epidural pulled. Repeat CBC scheduled for this afternoon per OB.   No notable events documented.  Last Vitals:  Vitals:   09/25/22 0845 09/25/22 0901  BP: 123/83 126/61  Pulse: 82 86  Resp: 20 13  Temp:    SpO2: 95% 98%    Last Pain:  Vitals:   09/25/22 0901  TempSrc:   PainSc: Asleep   Pain Goal:    LLE Motor Response: Purposeful movement (09/25/22 0901) LLE Sensation: Tingling (09/25/22 0901) RLE Motor Response: Purposeful movement (09/25/22 0901) RLE Sensation: Tingling (09/25/22 0901)     Epidural/Spinal Function Cutaneous sensation: Able to Wiggle Toes (09/25/22 0845), Patient able to flex knees: Yes (09/25/22 0845), Patient able to lift hips off bed: Yes (09/25/22 0845), Back pain beyond tenderness at insertion site: No (09/25/22 0845), Progressively worsening motor and/or sensory loss: No (09/25/22 0845), Bowel and/or bladder incontinence post epidural: No (09/25/22 0845)  Linton Rump

## 2022-09-26 LAB — CBC
HCT: 22.3 % — ABNORMAL LOW (ref 36.0–46.0)
Hemoglobin: 7.3 g/dL — ABNORMAL LOW (ref 12.0–15.0)
MCH: 26.6 pg (ref 26.0–34.0)
MCHC: 32.7 g/dL (ref 30.0–36.0)
MCV: 81.4 fL (ref 80.0–100.0)
Platelets: 281 10*3/uL (ref 150–400)
RBC: 2.74 MIL/uL — ABNORMAL LOW (ref 3.87–5.11)
RDW: 15.2 % (ref 11.5–15.5)
WBC: 16.6 10*3/uL — ABNORMAL HIGH (ref 4.0–10.5)
nRBC: 0 % (ref 0.0–0.2)

## 2022-09-26 MED ORDER — SODIUM CHLORIDE 0.9 % IV BOLUS: 500.0000 mL | Freq: Once | INTRAVENOUS | Status: DC | PRN

## 2022-09-26 MED ORDER — METHYLPREDNISOLONE SODIUM SUCC 125 MG IJ SOLR: 125.0000 mg | Freq: Once | INTRAMUSCULAR | Status: DC | PRN

## 2022-09-26 MED ORDER — SODIUM CHLORIDE 0.9 % IV SOLN: 250.0000 mg | Freq: Once | INTRAVENOUS | Status: DC

## 2022-09-26 MED ORDER — DIPHENHYDRAMINE HCL 50 MG/ML IJ SOLN: 25.0000 mg | Freq: Once | INTRAMUSCULAR | Status: DC | PRN

## 2022-09-26 MED ORDER — IRON SUCROSE 500 MG IVPB - SIMPLE MED
500.0000 mg | Freq: Once | INTRAVENOUS | Status: AC
  Administered 2022-09-26: 500 mg via INTRAVENOUS
  Filled 2022-09-26: qty 275

## 2022-09-26 MED ORDER — SODIUM CHLORIDE 0.9 % IV SOLN: INTRAVENOUS | Status: DC | PRN

## 2022-09-26 MED ORDER — SODIUM CHLORIDE 0.9 % IV SOLN: 510.0000 mg | Freq: Once | INTRAVENOUS | Status: DC

## 2022-09-26 MED ORDER — EPINEPHRINE PF 1 MG/ML IJ SOLN: 0.3000 mg | Freq: Once | INTRAMUSCULAR | Status: DC | PRN

## 2022-09-26 MED ORDER — ALBUTEROL SULFATE (2.5 MG/3ML) 0.083% IN NEBU: 2.5000 mg | INHALATION_SOLUTION | Freq: Once | RESPIRATORY_TRACT | Status: DC | PRN

## 2022-09-26 MED ORDER — EPINEPHRINE 1 MG/10ML IJ SOSY: 0.3000 mg | PREFILLED_SYRINGE | Freq: Once | INTRAMUSCULAR | Status: DC | PRN

## 2022-09-26 NOTE — Lactation Note (Signed)
This note was copied from a baby's chart. Lactation Consultation Note  Patient Name: Boy Zeanna Sunde [id number] Date: 09/26/2022 Age:36 hours Reason for consult: Initial assessment;Term Experienced BF mom stated that the baby is BF well having no difficulty. Mom stated she has no questions or concerns at this time. Mom denies painful latch. Mom feels that BF going well. Mom encouraged to feed baby 8-12 times/24 hours and with feeding cues.  Encouraged mom if she changes her mind or needs assistance to call.   Maternal Data Does the patient have breastfeeding experience prior to this delivery?: Yes How long did the patient breastfeed?: oldest child is 68 and youngest is 3. BF them from 1 yr to 1 1/2 yr each  Feeding    LATCH Score Latch:  (LC didn't see latch. baby finished BF)           Hold (Positioning): No assistance needed to correctly position infant at breast.      Lactation Tools Discussed/Used    Interventions Interventions: Breast feeding basics reviewed;Skin to skin;LC Services brochure  Discharge    Consult Status Consult Status: Complete Date: 09/26/22    Charyl Dancer 09/26/2022, 5:00 AM

## 2022-09-26 NOTE — Lactation Note (Signed)
This note was copied from a baby's chart. Lactation Consultation Note  Patient Name: Angela Brock [id number] Date: 09/26/2022 Age:36 hours  Attempted to see mom but she was sleeping.   Maternal Data    Feeding    LATCH Score                    Lactation Tools Discussed/Used    Interventions    Discharge    Consult Status      Danta Baumgardner, Diamond Nickel 09/26/2022, 1:06 AM

## 2022-09-26 NOTE — Progress Notes (Signed)
Subjective: Postpartum Day 1: Cesarean Delivery Patient reports still having some pubic symphysis pain. Denies syncope or significant dizziness.   Objective: Vital signs in last 24 hours: Temp:  [97.9 F (36.6 C)-98.4 F (36.9 C)] 98.4 F (36.9 C) (07/14 2058) Pulse Rate:  [79-85] 85 (07/14 2058) Resp:  [16-18] 18 (07/14 1610) BP: (119-125)/(48-60) 125/48 (07/14 2058) SpO2:  [97 %-100 %] 97 % (07/14 2058)  Physical Exam:  General: alert, cooperative, and appears stated age Lochia: appropriate Uterine Fundus: firm Incision: healing well DVT Evaluation: No evidence of DVT seen on physical exam.  Recent Labs    09/25/22 1353 09/26/22 0459  HGB 9.1* 7.3*  HCT 27.9* 22.3*    Assessment/Plan: Status post Cesarean section. Doing well postoperatively.  Continue current care ABLA,discussed iron transfusion with pt.Agrees to proceed Desires neonatal circumcision, R/B/A of procedure discussed at length. Pt understands that neonatal circumcision is not considered medically necessary and is elective. The risks include, but are not limited to bleeding, infection, damage to the penis, development of scar tissue, and having to have it redone at a later date. Pt understands theses risks and wishes to proceed   Waynard Reeds, MD 09/26/2022, 11:18 AM

## 2022-09-26 NOTE — Progress Notes (Signed)
MOB was referred for history of depression.  * Referral screened out by Clinical Social Worker because none of the following criteria appear to apply:  ~ History of depression during this pregnancy, or of post-partum depression following prior delivery.  ~ Diagnosis of depression within last 3 years  Per OB notes, MOB did not indicate any signs/symptoms during pregnancy.  OR  * MOB's symptoms currently being treated with medication and/or therapy.  Please contact the Clinical Social Worker if needs arise, by MOB request, or if MOB scores greater than 9/yes to question 10 on Edinburgh Postpartum Depression Screen.  Ashley Jones, LCSWA Clinical Social Worker [phone] 

## 2022-09-27 NOTE — Progress Notes (Signed)
Subjective: Postpartum Day 2: Cesarean Delivery Patient notes some incision pain but is otherwise well. Toelrating PO without N/V. Passing flatus but no BM. Has abulated in room but not hall. Voiding without issue. Baby boy s/p circ and nursing well. Would like abdominal binder prior to discharge. Denies syncope or significant dizziness.   Objective: Vital signs in last 24 hours: Temp:  [98.3 F (36.8 C)-98.5 F (36.9 C)] 98.5 F (36.9 C) (07/16 0520) Pulse Rate:  [91-93] 91 (07/16 0520) Resp:  [16-18] 18 (07/16 0520) BP: (124-138)/(57-85) 138/60 (07/16 0520) SpO2:  [98 %-100 %] 100 % (07/16 0520)  Physical Exam:  General: alert, cooperative, and appears stated age Lochia: appropriate Uterine Fundus: firm Incision: healing well DVT Evaluation: No evidence of DVT seen on physical exam.  Recent Labs    09/25/22 1353 09/26/22 0459  HGB 9.1* 7.3*  HCT 27.9* 22.3*    Assessment/Plan: Status post Cesarean section. Doing well postoperatively.  Continue current care ABLA,discussed iron transfusion with pt.Agrees to proceed > doing well since.  Baby boy s/p circ Desires discharge home POD#3, her mother will be home on Sunday to help with other children Abdominal binder ordered Contraception - desires interval tubal, papers already signed in office  Carlisle Cater, MD 09/27/2022, 9:03 AM

## 2022-09-28 MED ORDER — FERROUS SULFATE 325 (65 FE) MG PO TABS: 325.0000 mg | ORAL_TABLET | Freq: Every day | ORAL | 0 refills | Status: AC

## 2022-09-28 MED ORDER — IBUPROFEN 600 MG PO TABS: 600.0000 mg | ORAL_TABLET | Freq: Four times a day (QID) | ORAL | 1 refills | Status: AC | PRN

## 2022-09-28 MED ORDER — OXYCODONE-ACETAMINOPHEN 5-325 MG PO TABS: 1.0000 | ORAL_TABLET | Freq: Four times a day (QID) | ORAL | 0 refills | Status: AC | PRN

## 2022-09-28 NOTE — Discharge Instructions (Signed)
Call office with any concerns [phone] 

## 2022-09-28 NOTE — Progress Notes (Signed)
Subjective: Postpartum Day 3: Cesarean Delivery Patient reports incisional pain, tolerating PO, + flatus, + BM, and no problems voiding.  She denies fever, chills, SOB, CP or lightheadedness. She is bonding well with baby - breast/bottlefeeding. Has some questions about her c/s and about pp contraception. She feels ready for discharge home today   Objective: Vital signs in last 24 hours: Temp:  [98.2 F (36.8 C)-98.7 F (37.1 C)] 98.6 F (37 C) (07/17 0605) Pulse Rate:  [86-101] 86 (07/17 0605) Resp:  [18-19] 18 (07/17 0605) BP: (128-139)/(64-78) 129/76 (07/17 0605) SpO2:  [99 %-100 %] 99 % (07/17 1610)  Physical Exam:  General: alert, cooperative, and no distress Lochia: appropriate Uterine Fundus: firm Incision: no significant drainage DVT Evaluation: No evidence of DVT seen on physical exam. Calf/Ankle edema is present.  Recent Labs    09/25/22 1353 09/26/22 0459  HGB 9.1* 7.3*  HCT 27.9* 22.3*    Assessment/Plan: Status post Cesarean section. Doing well postoperatively.  Discharge home today. 2 week incision check and 6 week pp visit advised.  Discussed pp BC. May consider IUD or discuss pp BTL at visit. Cathrine Muster, DO 09/28/2022, 9:57 AM

## 2022-09-28 NOTE — Plan of Care (Signed)
Complete

## 2022-09-28 NOTE — Discharge Summary (Signed)
Postpartum Discharge Summary  Date of Service updated      Patient Name: Angela Brock DOB: 10-13-1985 MRN: [id number]  Date of admission: 09/24/2022 Delivery date:09/25/2022 Delivering provider: Philip Aspen Date of discharge: 09/28/2022  Admitting diagnosis: Pregnant and not yet delivered [Z34.90] Intrauterine pregnancy: [redacted]w[redacted]d     Secondary diagnosis:  Principal Problem:   Pregnant and not yet delivered  Additional problems: anemia of acute blood loss    Discharge diagnosis: Term Pregnancy Delivered and Anemia                                              Post partum procedures: n/a Augmentation: AROM and Pitocin Complications: Hemorrhage>1026mL  Hospital course: Induction of Labor With Cesarean Section   37 y.o. yo [id number] at [redacted]w[redacted]d was admitted to the hospital 09/24/2022 for induction of labor. Patient had a labor course significant for  cat 2 remote from delivery. The patient went for cesarean section due to Non-Reassuring FHR. Delivery details are as follows: Membrane Rupture Time/Date: 8:45 PM,09/24/2022  Delivery Method:C-Section, Low Transverse Details of operation can be found in separate operative Note.  Patient had a postpartum course complicated by anemia of acute blood loss. She is ambulating, tolerating a regular diet, passing flatus, and urinating well.  Patient is discharged home in stable condition on 09/28/22.      Newborn Data: Birth date:09/25/2022 Birth time:6:20 AM Gender:Female Living status:Living Apgars:8 ,9  Weight:4130 g                               Magnesium Sulfate received: No BMZ received: No Rhophylac:N/A Transfusion:No  Physical exam  Vitals:   09/27/22 0520 09/27/22 1430 09/27/22 2154 09/28/22 0605  BP: 138/60 128/64 139/78 129/76  Pulse: 91 87 (!) 101 86  Resp: 18 18 19 18   Temp: 98.5 F (36.9 C) 98.7 F (37.1 C) 98.2 F (36.8 C) 98.6 F (37 C)  TempSrc: Oral Oral Oral Oral  SpO2: 100% 99% 100% 99%  Weight:      Height:        General: alert, cooperative, and no distress Lochia: appropriate Uterine Fundus: firm Incision: Dressing is clean, dry, and intact DVT Evaluation: No evidence of DVT seen on physical exam. Calf/Ankle edema is present Labs: Lab Results  Component Value Date   WBC 16.6 (H) 09/26/2022   HGB 7.3 (L) 09/26/2022   HCT 22.3 (L) 09/26/2022   MCV 81.4 09/26/2022   PLT 281 09/26/2022      Latest Ref Rng & Units 09/25/2022    8:21 AM  CMP  Creatinine 0.44 - 1.00 mg/dL 6.21    Edinburgh Score:    09/26/2022    4:31 PM  Edinburgh Postnatal Depression Scale Screening Tool  I have been able to laugh and see the funny side of things. 0  I have looked forward with enjoyment to things. 0  I have blamed myself unnecessarily when things went wrong. 0  I have been anxious or worried for no good reason. 0  I have felt scared or panicky for no good reason. 0  Things have been getting on top of me. 0  I have been so unhappy that I have had difficulty sleeping. 0  I have felt sad or miserable. 0  I have been so unhappy that  I have been crying. 0  The thought of harming myself has occurred to me. 0  Edinburgh Postnatal Depression Scale Total 0      After visit meds:  Allergies as of 09/28/2022   No Known Allergies      Medication List     STOP taking these medications    acetaminophen 325 MG tablet Commonly known as: Tylenol       TAKE these medications    ferrous sulfate 325 (65 FE) MG tablet Take 1 tablet (325 mg total) by mouth daily with breakfast.   ibuprofen 600 MG tablet Commonly known as: ADVIL Take 1 tablet (600 mg total) by mouth every 6 (six) hours as needed for moderate pain or cramping.   oxyCODONE-acetaminophen 5-325 MG tablet Commonly known as: Percocet Take 1 tablet by mouth every 6 (six) hours as needed for up to 7 days.   prenatal multivitamin Tabs tablet Take 1 tablet by mouth daily at 12 noon.         Discharge home in stable  condition Infant Feeding: Bottle and Breast Infant Disposition:home with mother Discharge instruction: per After Visit Summary and Postpartum booklet. Activity: Advance as tolerated. Pelvic rest for 6 weeks.  Diet: iron rich diet Anticipated Birth Control: Plans Interval BTL Postpartum Appointment:6 weeks Additional Postpartum F/U: Incision check 2 weeks Future Appointments:No future appointments. Follow up Visit:  Follow-up Information     [facility] Schedule an appointment as soon as possible for a visit.   Why: 2 weeks for incision check and 6 weeks for postpartum visit Contact information: [address]  [address] [place] [address] [phone]                     09/28/2022 Cathrine Muster, DO

## 2022-10-02 ENCOUNTER — Inpatient Hospital Stay (HOSPITAL_COMMUNITY): Payer: Medicaid Other

## 2022-10-02 ENCOUNTER — Inpatient Hospital Stay (HOSPITAL_COMMUNITY)
Admission: RE | Admit: 2022-10-02 | Payer: Medicaid Other | Source: Home / Self Care | Admitting: Obstetrics and Gynecology

## 2022-10-10 ENCOUNTER — Inpatient Hospital Stay (HOSPITAL_COMMUNITY)
Admission: AD | Admit: 2022-10-10 | Discharge: 2022-10-10 | Disposition: A | Discharge status: Home / Self Care | Payer: Medicaid Other | Source: Home / Self Care | Attending: Obstetrics and Gynecology | Admitting: Obstetrics and Gynecology

## 2022-10-10 ENCOUNTER — Encounter (HOSPITAL_COMMUNITY): Payer: Self-pay | Admitting: Obstetrics and Gynecology

## 2022-10-10 DIAGNOSIS — O165 Unspecified maternal hypertension, complicating the puerperium: Secondary | ICD-10-CM | POA: Insufficient documentation

## 2022-10-10 DIAGNOSIS — Z3A42 42 weeks gestation of pregnancy: Secondary | ICD-10-CM

## 2022-10-10 LAB — CBC
HCT: 27 % — ABNORMAL LOW (ref 36.0–46.0)
Hemoglobin: 8.5 g/dL — ABNORMAL LOW (ref 12.0–15.0)
MCH: 26.7 pg (ref 26.0–34.0)
MCHC: 31.5 g/dL (ref 30.0–36.0)
MCV: 84.9 fL (ref 80.0–100.0)
Platelets: 451 10*3/uL — ABNORMAL HIGH (ref 150–400)
RBC: 3.18 MIL/uL — ABNORMAL LOW (ref 3.87–5.11)
RDW: 15.8 % — ABNORMAL HIGH (ref 11.5–15.5)
WBC: 8.9 10*3/uL (ref 4.0–10.5)
nRBC: 0 % (ref 0.0–0.2)

## 2022-10-10 LAB — COMPREHENSIVE METABOLIC PANEL
ALT: 24 U/L (ref 0–44)
AST: 16 U/L (ref 15–41)
Albumin: 3.1 g/dL — ABNORMAL LOW (ref 3.5–5.0)
Alkaline Phosphatase: 74 U/L (ref 38–126)
Anion gap: 13 (ref 5–15)
BUN: 9 mg/dL (ref 6–20)
CO2: 25 mmol/L (ref 22–32)
Calcium: 8.5 mg/dL — ABNORMAL LOW (ref 8.9–10.3)
Chloride: 102 mmol/L (ref 98–111)
Creatinine, Ser: 0.86 mg/dL (ref 0.44–1.00)
GFR, Estimated: >60 mL/min (ref >60)
Glucose, Bld: 98 mg/dL (ref 70–99)
Potassium: 4 mmol/L (ref 3.5–5.1)
Sodium: 140 mmol/L (ref 135–145)
Total Bilirubin: 0.5 mg/dL (ref 0.3–1.2)
Total Protein: 5.6 g/dL — ABNORMAL LOW (ref 6.5–8.1)

## 2022-10-10 MED ORDER — NIFEDIPINE 10 MG PO CAPS
20.0000 mg | ORAL_CAPSULE | ORAL | Status: DC | PRN
  Filled 2022-10-10: qty 2

## 2022-10-10 MED ORDER — NIFEDIPINE ER OSMOTIC RELEASE 30 MG PO TB24: 30.0000 mg | ORAL_TABLET | Freq: Two times a day (BID) | ORAL | 0 refills | Status: DC

## 2022-10-10 MED ORDER — HYDRALAZINE HCL 25 MG PO TABS
25.0000 mg | ORAL_TABLET | Freq: Once | ORAL | Status: DC
  Filled 2022-10-10: qty 1

## 2022-10-10 MED ORDER — NIFEDIPINE 10 MG PO CAPS
10.0000 mg | ORAL_CAPSULE | Freq: Once | ORAL | Status: AC
  Administered 2022-10-10: 10 mg via ORAL
  Filled 2022-10-10: qty 1

## 2022-10-10 MED ORDER — NIFEDIPINE 10 MG PO CAPS
10.0000 mg | ORAL_CAPSULE | ORAL | Status: DC | PRN
  Administered 2022-10-10: 10 mg via ORAL
  Filled 2022-10-10: qty 1

## 2022-10-10 MED ORDER — LABETALOL HCL 5 MG/ML IV SOLN: 40.0000 mg | INTRAVENOUS | Status: DC | PRN

## 2022-10-10 MED ORDER — NIFEDIPINE ER OSMOTIC RELEASE 30 MG PO TB24: 30.0000 mg | ORAL_TABLET | Freq: Every day | ORAL | 0 refills | Status: DC

## 2022-10-10 MED ORDER — NIFEDIPINE 10 MG PO CAPS
20.0000 mg | ORAL_CAPSULE | ORAL | Status: DC | PRN
  Administered 2022-10-10: 20 mg via ORAL

## 2022-10-10 NOTE — MAU Provider Note (Addendum)
Chief Complaint:  Hypertension  HPI   Event Date/Time   First Provider Initiated Contact with Patient 10/10/22 1923     Angela Brock is a 37 y.o. [id number] at 13 days postpartum who presents to maternity admissions reporting elevated blood pressure, sent by her office. Has a history of hypertension prior to pregnancy but none during pregnancy or in the immediate postpartum period. Has had pounding in the back of her head intermittently for the past week but this goes away with rest/hydration. Denies visual disturbances, dizziness, epigastric pain or excessive swelling. No other physical complaints.   Pregnancy Course: Received care from [facility].  Past Medical History:  Diagnosis Date   Abnormal Pap smear    age 76    BV (bacterial vaginosis)    Depression    after 2nd pregnancy   H/O menorrhagia    Headache(784.0)    Hx of chlamydia infection    37 years old   Hx of dysmenorrhea    Normal pregnancy, incidental 07/18/2011   OB History  Gravida Para Term Preterm AB Living  5 5 5  0 0 5  SAB IAB Ectopic Multiple Live Births  0 0 0 0 5    # Outcome Date GA Lbr Len/2nd Weight Sex Type Anes PTL Lv  5 Term 09/25/22 [redacted]w[redacted]d  9 lb 1.7 oz (4.13 kg) M CS-LTranv EPI, Spinal  LIV  4 Term 02/08/19 [redacted]w[redacted]d 03:23 / 00:24 6 lb 14.6 oz (3.135 kg) F Vag-Spont EPI  LIV     Birth Comments: WNL  3 Term 05/30/17 [redacted]w[redacted]d 09:30 / 00:30 9 lb 3.8 oz (4.19 kg) M VBAC, Vacuum EPI  LIV  2 Term 05/06/15 [redacted]w[redacted]d 11:31 / 01:16 7 lb (3.175 kg) M VBAC, Vacuum Local, EPI  LIV  1 Term 10/26/11 [redacted]w[redacted]d  7 lb 14.1 oz (3.575 kg) F CS-LTranv EPI  LIV   Past Surgical History:  Procedure Laterality Date   CESAREAN SECTION  10/26/2011   Procedure: CESAREAN SECTION;  Surgeon: Michael Litter, MD;  Location: [facility];  Service: Gynecology;  Laterality: N/A;   CESAREAN SECTION N/A 09/25/2022   Procedure: CESAREAN SECTION;  Surgeon: Philip Aspen, DO;  Location: [facility];  Service: Obstetrics;  Laterality: N/A;    Family History  Problem Relation Age of Onset   Hypertension Mother    Depression Mother    Migraines Mother    Alcohol abuse Maternal Grandmother    Alcohol abuse Maternal Grandfather    Alcohol abuse Paternal Grandfather    Anesthesia problems Neg Hx    Social History   Tobacco Use   Smoking status: Former    Current packs/day: 1.00    Types: Cigarettes   Smokeless tobacco: Never  Vaping Use   Vaping status: Never Used  Substance Use Topics   Alcohol use: No    Alcohol/week: 2.0 standard drinks of alcohol    Types: 2 Shots of liquor per week    Comment: not currently while pregnant    Drug use: Not Currently    Frequency: 2.0 times per week    Types: Marijuana    Comment: quit June 2016   No Known Allergies Medications Prior to Admission  Medication Sig Dispense Refill Last Dose   ferrous sulfate 325 (65 FE) MG tablet Take 1 tablet (325 mg total) by mouth daily with breakfast. 30 tablet 0 Past Week   ibuprofen (ADVIL) 600 MG tablet Take 1 tablet (600 mg total) by mouth every 6 (six) hours  as needed for moderate pain or cramping. 40 tablet 1 10/09/2022   oxycodone-acetaminophen (PERCOCET) 2.5-325 MG tablet Take 1 tablet by mouth every 4 (four) hours as needed for pain.   10/09/2022 at 2100   Prenatal Vit-Fe Fumarate-FA (PRENATAL MULTIVITAMIN) TABS tablet Take 1 tablet by mouth daily at 12 noon.   10/09/2022   I have reviewed patient's Past Medical Hx, Surgical Hx, Family Hx, Social Hx, medications and allergies.   ROS  Pertinent items noted in HPI and remainder of comprehensive ROS otherwise negative.   PHYSICAL EXAM  Patient Vitals for the past 24 hrs:  BP Temp Temp src Pulse Resp SpO2 Height Weight  10/10/22 2228 120/66 -- -- -- -- -- -- --  10/10/22 2225 -- -- -- -- -- 98 % -- --  10/10/22 2220 -- -- -- -- -- 98 % -- --  10/10/22 2215 -- -- -- -- -- 99 % -- --  10/10/22 2210 -- -- -- -- -- 99 % -- --  10/10/22 2206 (!) 158/77 -- -- 90 -- -- -- --  10/10/22 2141  (!) 155/71 -- -- 79 -- -- -- --  10/10/22 2140 -- -- -- -- -- 99 % -- --  10/10/22 2135 -- -- -- -- -- 99 % -- --  10/10/22 2130 -- -- -- -- -- 98 % -- --  10/10/22 2126 (!) 166/74 -- -- -- -- -- -- --  10/10/22 2125 -- -- -- -- -- 97 % -- --  10/10/22 2121 (!) 166/74 -- -- 87 -- -- -- --  10/10/22 2120 -- -- -- -- -- 98 % -- --  10/10/22 2115 -- -- -- -- -- 96 % -- --  10/10/22 2110 -- -- -- -- -- 97 % -- --  10/10/22 2105 -- -- -- -- -- 96 % -- --  10/10/22 2100 (!) 154/74 -- -- 75 -- 97 % -- --  10/10/22 2055 -- -- -- -- -- 98 % -- --  10/10/22 2050 -- -- -- -- -- 100 % -- --  10/10/22 2036 (!) 158/76 -- -- 86 -- -- -- --  10/10/22 2035 -- -- -- -- -- 98 % -- --  10/10/22 2025 -- -- -- -- -- 99 % -- --  10/10/22 2020 -- -- -- -- -- 100 % -- --  10/10/22 2016 (!) 160/81 -- -- 88 -- -- -- --  10/10/22 2015 -- -- -- -- -- 99 % -- --  10/10/22 2001 (!) 154/90 -- -- 92 -- -- -- --  10/10/22 2000 -- -- -- -- -- 100 % -- --  10/10/22 1955 -- -- -- -- -- 97 % -- --  10/10/22 1950 -- -- -- -- -- 99 % -- --  10/10/22 1946 (!) 152/81 -- -- 79 -- -- -- --  10/10/22 1945 -- -- -- -- -- 100 % -- --  10/10/22 1940 -- -- -- -- -- 100 % -- --  10/10/22 1935 -- -- -- -- -- 99 % -- --  10/10/22 1934 (!) 169/95 -- -- 76 -- -- -- --  10/10/22 1931 (!) 176/104 -- -- 74 -- -- -- --  10/10/22 1930 -- -- -- -- -- 100 % -- --  10/10/22 1925 -- -- -- -- -- 100 % -- --  10/10/22 1920 -- -- -- -- -- 100 % -- --  10/10/22 1916 (!) 157/91 -- -- 80 -- -- -- --  10/10/22 1915 -- -- -- -- --  98 % -- --  10/10/22 1912 (!) 151/88 -- -- 78 -- -- -- --  10/10/22 1858 (!) 145/76 98.2 F (36.8 C) Oral 77 18 99 % 5\' 4"  (1.626 m) 242 lb (109.8 kg)   Constitutional: Well-developed, well-nourished female in no acute distress.  Cardiovascular: normal rate & rhythm, warm and well-perfused Respiratory: normal effort, no problems with respiration noted GI: Abd soft, non-tender, non-distended MS: Extremities nontender,  no edema, normal ROM Neurologic: Alert and oriented x 4.  GU: no CVA tenderness Pelvic: exam deferred Extremities: trace edema in feet/ankles  Labs: Results for orders placed or performed during the hospital encounter of 10/10/22 (from the past 24 hour(s))  CBC     Status: Abnormal   Collection Time: 10/10/22  7:37 PM  Result Value Ref Range   WBC 8.9 4.0 - 10.5 K/uL   RBC 3.18 (L) 3.87 - 5.11 MIL/uL   Hemoglobin 8.5 (L) 12.0 - 15.0 g/dL   HCT 11.9 (L) 14.7 - 82.9 %   MCV 84.9 80.0 - 100.0 fL   MCH 26.7 26.0 - 34.0 pg   MCHC 31.5 30.0 - 36.0 g/dL   RDW 56.2 (H) 13.0 - 86.5 %   Platelets 451 (H) 150 - 400 K/uL   nRBC 0.0 0.0 - 0.2 %  Comprehensive metabolic panel     Status: Abnormal   Collection Time: 10/10/22  7:37 PM  Result Value Ref Range   Sodium 140 135 - 145 mmol/L   Potassium 4.0 3.5 - 5.1 mmol/L   Chloride 102 98 - 111 mmol/L   CO2 25 22 - 32 mmol/L   Glucose, Bld 98 70 - 99 mg/dL   BUN 9 6 - 20 mg/dL   Creatinine, Ser 7.84 0.44 - 1.00 mg/dL   Calcium 8.5 (L) 8.9 - 10.3 mg/dL   Total Protein 5.6 (L) 6.5 - 8.1 g/dL   Albumin 3.1 (L) 3.5 - 5.0 g/dL   AST 16 15 - 41 U/L   ALT 24 0 - 44 U/L   Alkaline Phosphatase 74 38 - 126 U/L   Total Bilirubin 0.5 0.3 - 1.2 mg/dL   GFR, Estimated >69 >62 mL/min   Anion gap 13 5 - 15   Imaging:  No results found.  MDM & MAU COURSE  MDM: High  MAU Course: Orders Placed This Encounter  Procedures   CBC   Comprehensive metabolic panel   Notify physician (specify) Confirmatory reading of BP> 160/110 15 minutes later   Apply Hypertensive Disorders of Pregnancy Care Plan   Measure blood pressure   Discharge patient   Meds ordered this encounter  Medications   AND Linked Order Group    NIFEdipine (PROCARDIA) capsule 10 mg    NIFEdipine (PROCARDIA) capsule 20 mg    NIFEdipine (PROCARDIA) capsule 20 mg    labetalol (NORMODYNE) injection 40 mg   NIFEdipine (PROCARDIA) capsule 10 mg   DISCONTD: hydrALAZINE (APRESOLINE)  tablet 25 mg   DISCONTD: NIFEdipine (PROCARDIA-XL/NIFEDICAL-XL) 30 MG 24 hr tablet    Sig: Take 1 tablet (30 mg total) by mouth 2 (two) times daily at 8 am and 10 pm. Take first dose with breakfast and 2nd dose before bed.    Dispense:  60 tablet    Refill:  0   NIFEdipine (PROCARDIA-XL/NIFEDICAL-XL) 30 MG 24 hr tablet    Sig: Take 1 tablet (30 mg total) by mouth daily.    Dispense:  30 tablet    Refill:  0   BP 150s/70s, ordered  a dose of 10mg  immediate release nifedipine, but then pt had two severe range pressures so ordered the nifedipine hypertensive protocol.  Labs normal aside from elevated platelets, pt so far required 20mg  total nifedipine. Dr. [name] agreed pt can go home once BP down with home meds and pt can call in the am for a BP check at the end of the week.   BP still elevated 160s-150s/70s after another 20mg  nifedipine given, 25mg  apresoline ordered. Before it came up from pharmacy, pt had a BP of 120/66. Apresoline discontinued, pt was up and moving around during that BP - stable for discharge home.  ASSESSMENT   1. Postpartum hypertension    PLAN  Discharge home in stable condition with return precautions    F/U should have been [facility], not [place]   Allergies as of 10/10/2022   No Known Allergies      Medication List     TAKE these medications    ferrous sulfate 325 (65 FE) MG tablet Take 1 tablet (325 mg total) by mouth daily with breakfast.   ibuprofen 600 MG tablet Commonly known as: ADVIL Take 1 tablet (600 mg total) by mouth every 6 (six) hours as needed for moderate pain or cramping.   NIFEdipine 30 MG 24 hr tablet Commonly known as: PROCARDIA-XL/NIFEDICAL-XL Take 1 tablet (30 mg total) by mouth daily.   oxycodone-acetaminophen 2.5-325 MG tablet Commonly known as: PERCOCET Take 1 tablet by mouth every 4 (four) hours as needed for pain.   prenatal multivitamin Tabs tablet Take 1 tablet by mouth daily at 12 noon.         Edd Arbour, CNM, MSN, IBCLC Certified Nurse Midwife, [facility]

## 2022-10-10 NOTE — MAU Note (Signed)
Angela Brock is a 37 y.o. at here in MAU reporting: c/s on 7/14. Went to dr today, BP was high 163/?  Told to come in and make sure she doesn't have Pre- eclampsia.  Denies HA today. Has had pounding at the back of her head off and on. Denies visual changes or epigastric pain.  Reports swelling in left ankle. Had been on BP meds prior to the pregnancy.  Onset of complaint: today Pain score: none Vitals:   10/10/22 1858  BP: (!) 145/76  Pulse: 77  Resp: 18  Temp: 98.2 F (36.8 C)  SpO2: 99%      Lab orders placed from triage:

## 2022-10-12 IMAGING — CT CT NECK W/ CM
5 of 6 series · 17 of 34 positions shown, 18 images · IV contrast (Omni 300)
Comparison: None.

CLINICAL DATA: Soft tissue swelling, infection suspected, neck xray
done

EXAM:
CT NECK WITH CONTRAST
TECHNIQUE: Multidetector CT imaging of the neck was performed using the
standard protocol following the bolus administration of intravenous
contrast.

[Series 3: neck 2.0 st · axial · 0.51mm/px · z∈[+854,+926]mm · 2 of 108 slices shown]
[im 36/108  bone]
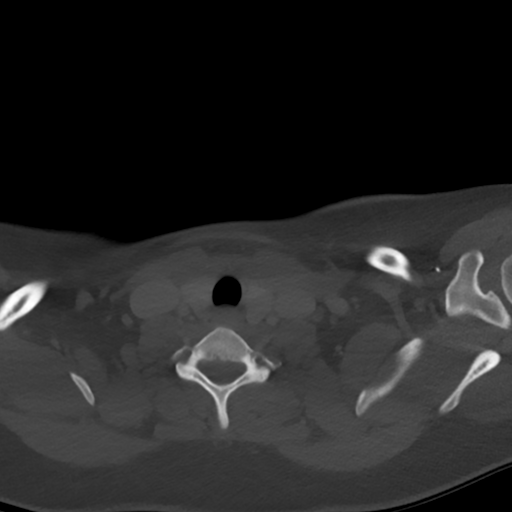
[im 72/108  bone]
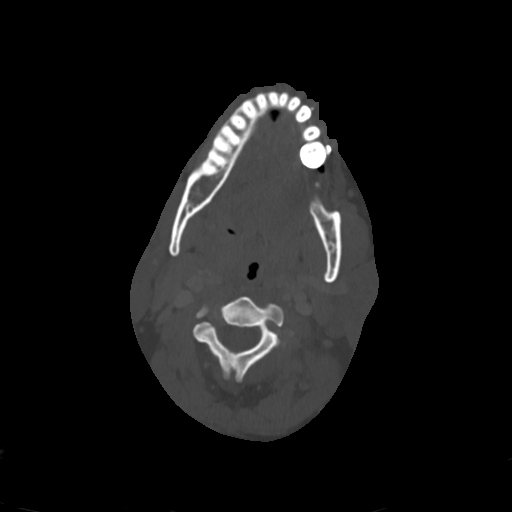

[Series 4: sagittal · sagittal · 0.45mm/px · 6 of 73 slices shown]
[im 13/73  bone]
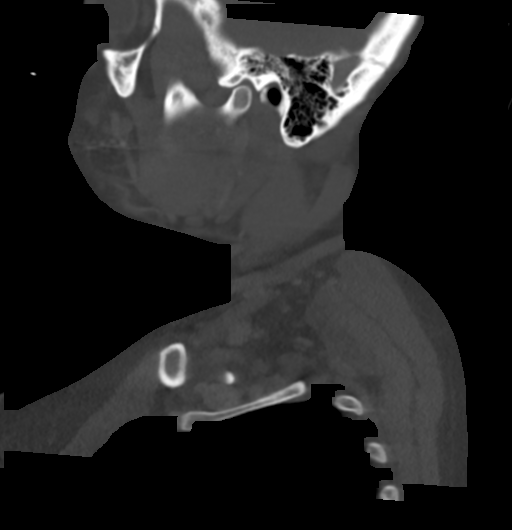
[im 15/73  soft-tissue]
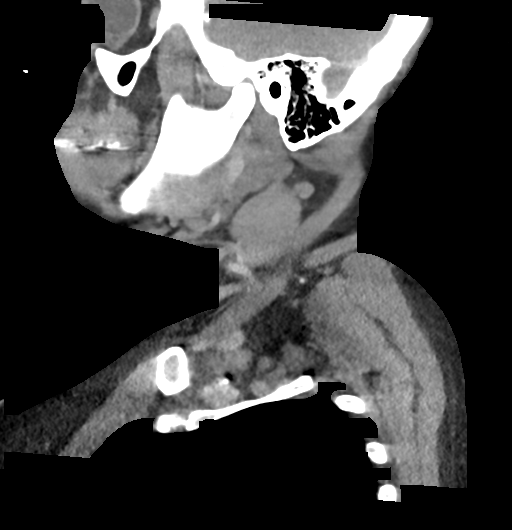
[im 25/73  bone]
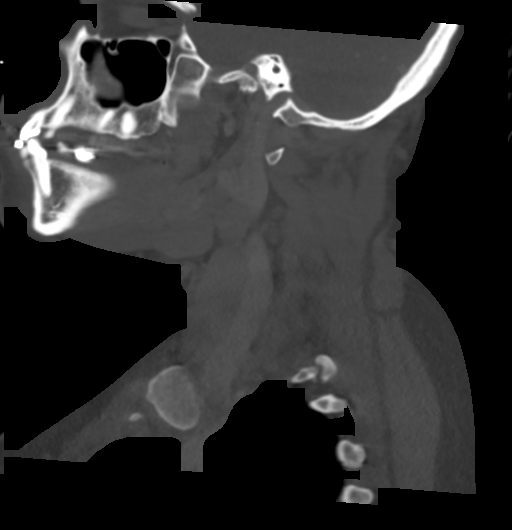
[im 37/73  bone]
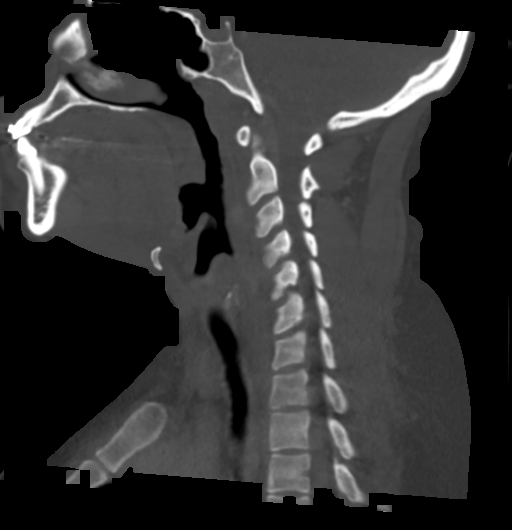
[im 49/73  bone]
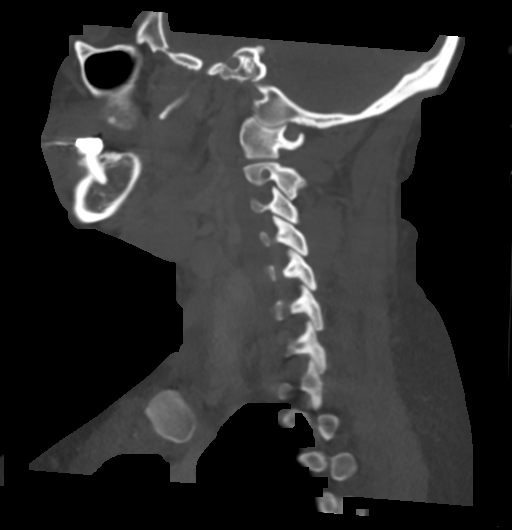
[im 61/73  bone]
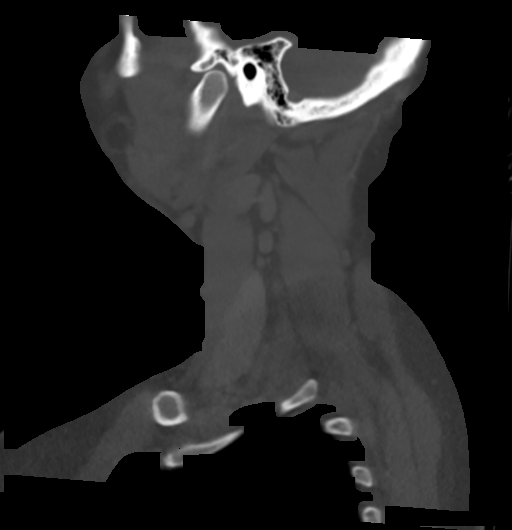

[Series 6: coronal · coronal · 0.34mm/px · 3 of 102 slices shown]
[im 21/102  bone]
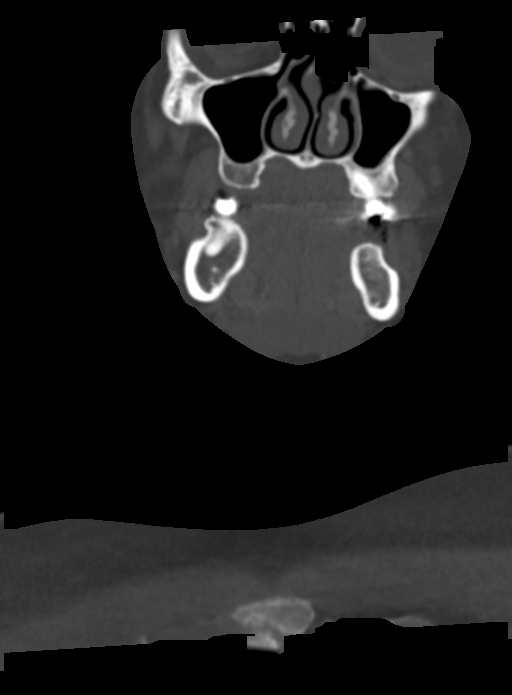
[im 41/102  bone]
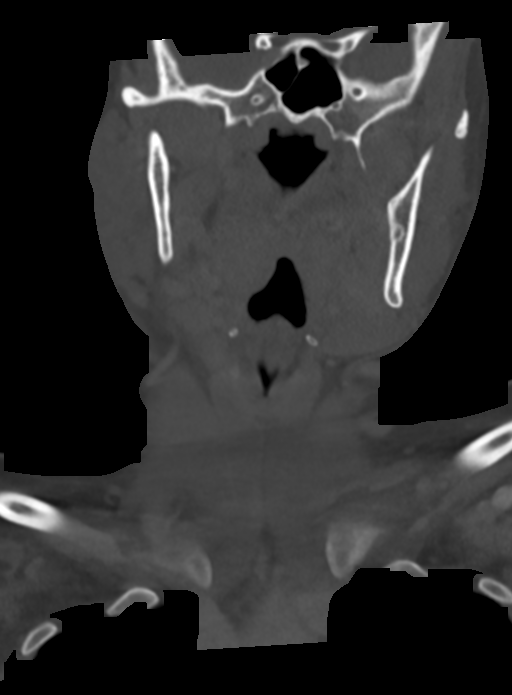
[im 61/102  bone]
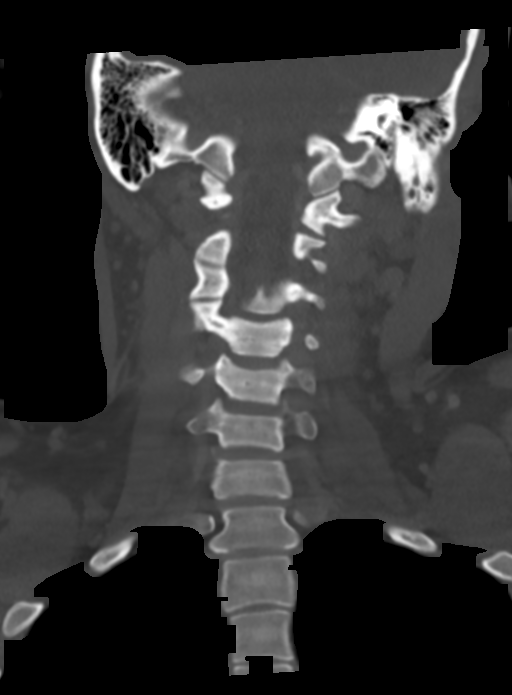

[Series 7: orthogonal · axial · 0.39mm/px · z∈[+829,+942]mm · 3 of 115 slices shown, 4 images]
[im 29/115  soft-tissue]
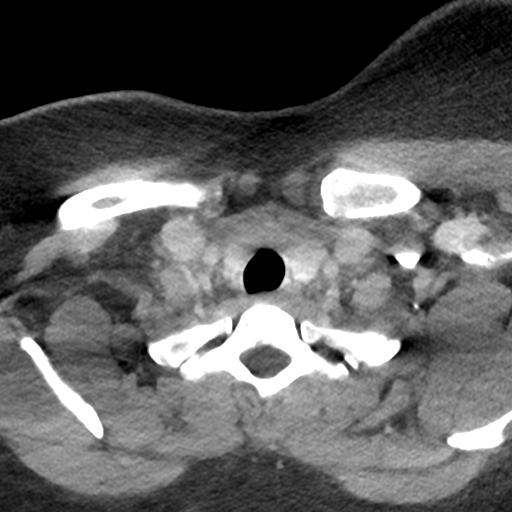
[im 29/115  bone]
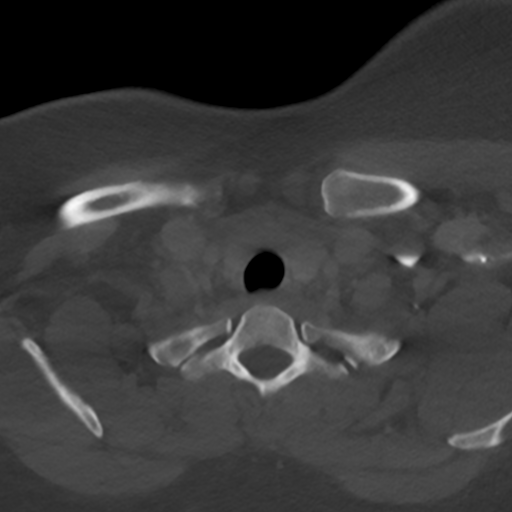
[im 58/115  bone]
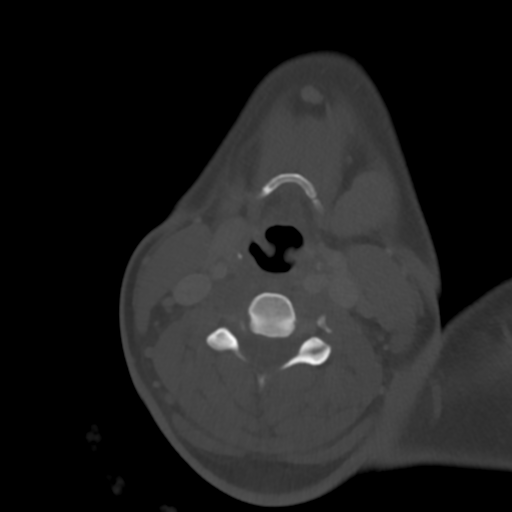
[im 86/115  bone]
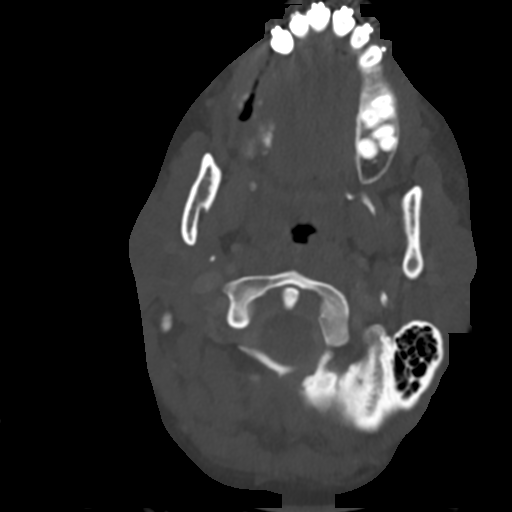

[Series 8: bone · axial · 0.51mm/px · z∈[+848,+958]mm · 3 of 112 slices shown]
[im 28/112  bone]
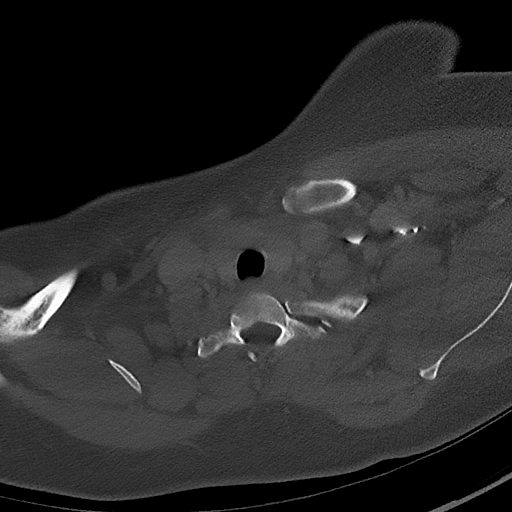
[im 56/112  bone]
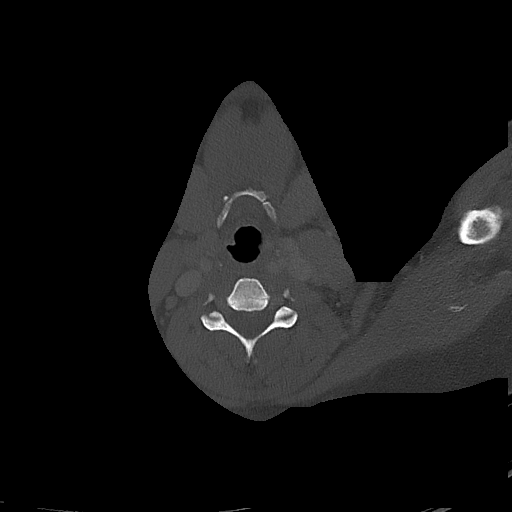
[im 84/112  bone]
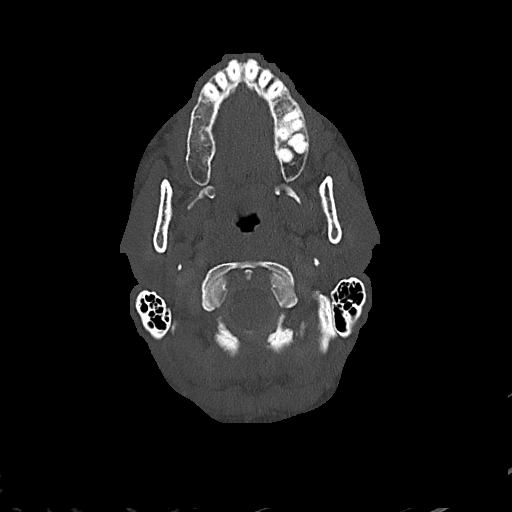

[17 of 34 positions shown; findings below may reference images not displayed]

RADIATION DOSE REDUCTION: This exam was performed according to the
departmental dose-optimization program which includes automated
exposure control, adjustment of the mA and/or kV according to
patient size and/or use of iterative reconstruction technique.

CONTRAST:  100mL OMNIPAQUE IOHEXOL 300 MG/ML  SOLN
FINDINGS: Pharynx and larynx: Enlarged and edematous palatine tonsils
bilaterally, compatible with tonsillitis. Narrowing of the
oropharynx, which remains patent. No visible discrete, drainable
fluid collection. Epiglottis and larynx is unremarkable.

Salivary glands: No inflammation, mass, or stone.

Thyroid: Normal.

Lymph nodes: Enlarged upper cervical chain nodes bilaterally.

Vascular: Limited evaluation due to non arterial timing. Visualized
major arteries appear grossly patent in the neck.

Limited intracranial: Negative.

Visualized orbits: Negative.

Mastoids and visualized paranasal sinuses: Clear.

Skeleton: No evidence of acute abnormality limited assessment.

Upper chest: Visualized lung apices are clear. Residual thymus in
the anterior mediastinum.
IMPRESSION: 1. Enlarged and edematous palatine tonsils bilaterally, compatible
with tonsillitis. No evidence of abscess.
2. Enlarged upper cervical chain nodes bilaterally, nonspecific but
likely reactive given the above findings and reported clinical
history.

## 2022-10-21 ENCOUNTER — Telehealth (HOSPITAL_COMMUNITY): Payer: Self-pay | Admitting: *Deleted

## 2022-10-21 NOTE — Telephone Encounter (Signed)
10/21/2022  Name: Angela Brock MRN: [id number] DOB: [birth year]  Reason for Call:  Transition of Care Hospital Discharge Call  Contact Status: Patient Contact Status: Message  Language assistant needed:          Follow-Up Questions:    Angela Brock Postnatal Depression Scale:  In the Past 7 Days:    PHQ2-9 Depression Scale:     Discharge Follow-up:    Post-discharge interventions: NA  Salena Saner, RN 10/21/2022 12:18

## 2023-01-25 ENCOUNTER — Other Ambulatory Visit: Payer: Self-pay | Admitting: Certified Nurse Midwife

## 2023-11-07 ENCOUNTER — Emergency Department (HOSPITAL_COMMUNITY)
Admission: EM | Admit: 2023-11-07 | Discharge: 2023-11-07 | Discharge status: Against Medical Advice | Attending: Emergency Medicine | Admitting: Emergency Medicine

## 2023-11-07 DIAGNOSIS — Z5321 Procedure and treatment not carried out due to patient leaving prior to being seen by health care provider: Secondary | ICD-10-CM | POA: Insufficient documentation

## 2023-11-07 DIAGNOSIS — H9202 Otalgia, left ear: Secondary | ICD-10-CM | POA: Insufficient documentation

## 2023-11-07 NOTE — ED Triage Notes (Signed)
 Pt here for left ear pain that started 2 weeks ago. Denies sinus pressure/drainage. No fevers. Axox4.

## 2023-11-07 NOTE — ED Notes (Signed)
Pt called x 3 no answer
